# Patient Record
Sex: Female | Born: 1980
Health system: Southern US, Community
[De-identification: ages and names within clinical notes are randomized; demographics above are authoritative.]

## PROBLEM LIST (undated history)

## (undated) DIAGNOSIS — N809 Endometriosis, unspecified: Secondary | ICD-10-CM

## (undated) DIAGNOSIS — N189 Chronic kidney disease, unspecified: Secondary | ICD-10-CM

## (undated) DIAGNOSIS — F988 Other specified behavioral and emotional disorders with onset usually occurring in childhood and adolescence: Secondary | ICD-10-CM

## (undated) DIAGNOSIS — R519 Headache, unspecified: Secondary | ICD-10-CM

## (undated) DIAGNOSIS — R51 Headache: Secondary | ICD-10-CM

## (undated) DIAGNOSIS — R109 Unspecified abdominal pain: Secondary | ICD-10-CM

## (undated) DIAGNOSIS — N029 Recurrent and persistent hematuria with unspecified morphologic changes: Secondary | ICD-10-CM

## (undated) DIAGNOSIS — R10A1 Flank pain, right side: Secondary | ICD-10-CM

## (undated) HISTORY — PX: MOLE REMOVAL: SHX2046

## (undated) HISTORY — DX: Endometriosis, unspecified: N80.9

## (undated) HISTORY — PX: LAPAROSCOPY: SHX197

## (undated) HISTORY — PX: OTHER SURGICAL HISTORY: SHX169

---

## 2006-12-27 ENCOUNTER — Emergency Department: Payer: Self-pay | Admitting: Emergency Medicine

## 2008-01-08 ENCOUNTER — Ambulatory Visit: Payer: Self-pay | Admitting: Vascular Surgery

## 2008-01-17 ENCOUNTER — Ambulatory Visit: Payer: Self-pay | Admitting: Vascular Surgery

## 2008-01-23 ENCOUNTER — Ambulatory Visit: Payer: Self-pay | Admitting: Vascular Surgery

## 2008-03-05 ENCOUNTER — Ambulatory Visit: Payer: Self-pay | Admitting: Internal Medicine

## 2008-04-29 ENCOUNTER — Ambulatory Visit: Payer: Self-pay | Admitting: Internal Medicine

## 2010-11-07 HISTORY — PX: TYMPANOSTOMY TUBE PLACEMENT: SHX32

## 2010-11-07 HISTORY — PX: PARTIAL HYSTERECTOMY: SHX80

## 2011-11-30 ENCOUNTER — Ambulatory Visit: Payer: Self-pay | Admitting: Unknown Physician Specialty

## 2011-12-09 ENCOUNTER — Ambulatory Visit: Payer: Self-pay | Admitting: Unknown Physician Specialty

## 2012-11-07 HISTORY — PX: NASAL ENDOSCOPY: SHX286

## 2012-11-07 HISTORY — PX: COLONOSCOPY: SHX174

## 2012-12-03 DIAGNOSIS — Z0001 Encounter for general adult medical examination with abnormal findings: Secondary | ICD-10-CM | POA: Insufficient documentation

## 2012-12-03 DIAGNOSIS — Z0000 Encounter for general adult medical examination without abnormal findings: Secondary | ICD-10-CM | POA: Insufficient documentation

## 2013-06-14 ENCOUNTER — Ambulatory Visit: Payer: Self-pay | Admitting: Family Medicine

## 2013-11-07 HISTORY — PX: SKIN LESION EXCISION: SHX2412

## 2013-12-27 HISTORY — PX: APPENDECTOMY: SHX54

## 2013-12-27 HISTORY — PX: ABDOMINAL HYSTERECTOMY: SHX81

## 2014-01-08 DIAGNOSIS — N958 Other specified menopausal and perimenopausal disorders: Secondary | ICD-10-CM | POA: Insufficient documentation

## 2014-01-08 DIAGNOSIS — E8941 Symptomatic postprocedural ovarian failure: Secondary | ICD-10-CM | POA: Insufficient documentation

## 2014-02-25 ENCOUNTER — Other Ambulatory Visit: Payer: Self-pay

## 2014-02-25 LAB — COMPREHENSIVE METABOLIC PANEL
ALBUMIN: 4 g/dL (ref 3.4–5.0)
ALT: 13 U/L (ref 12–78)
ANION GAP: 5 — AB (ref 7–16)
Alkaline Phosphatase: 67 U/L
BUN: 10 mg/dL (ref 7–18)
Bilirubin,Total: 0.3 mg/dL (ref 0.2–1.0)
CALCIUM: 8.7 mg/dL (ref 8.5–10.1)
CO2: 28 mmol/L (ref 21–32)
Chloride: 106 mmol/L (ref 98–107)
Creatinine: 0.66 mg/dL (ref 0.60–1.30)
EGFR (African American): >60
EGFR (Non-African Amer.): >60
GLUCOSE: 87 mg/dL (ref 65–99)
Osmolality: 276 (ref 275–301)
Potassium: 3.9 mmol/L (ref 3.5–5.1)
SGOT(AST): 15 U/L (ref 15–37)
Sodium: 139 mmol/L (ref 136–145)
TOTAL PROTEIN: 7.7 g/dL (ref 6.4–8.2)

## 2014-02-25 LAB — CBC WITH DIFFERENTIAL/PLATELET
BASOS PCT: 0.7 %
Basophil #: 0.1 10*3/uL (ref 0.0–0.1)
Eosinophil #: 0.1 10*3/uL (ref 0.0–0.7)
Eosinophil %: 1.2 %
HCT: 41.3 % (ref 35.0–47.0)
HGB: 13.8 g/dL (ref 12.0–16.0)
LYMPHS ABS: 2.6 10*3/uL (ref 1.0–3.6)
Lymphocyte %: 32.8 %
MCH: 30.9 pg (ref 26.0–34.0)
MCHC: 33.3 g/dL (ref 32.0–36.0)
MCV: 93 fL (ref 80–100)
MONO ABS: 0.4 x10 3/mm (ref 0.2–0.9)
Monocyte %: 5.1 %
NEUTROS ABS: 4.7 10*3/uL (ref 1.4–6.5)
Neutrophil %: 60.2 %
PLATELETS: 271 10*3/uL (ref 150–440)
RBC: 4.46 10*6/uL (ref 3.80–5.20)
RDW: 12.8 % (ref 11.5–14.5)
WBC: 7.8 10*3/uL (ref 3.6–11.0)

## 2014-02-25 LAB — TSH: THYROID STIMULATING HORM: 1.3 u[IU]/mL

## 2014-02-28 ENCOUNTER — Ambulatory Visit: Payer: Self-pay

## 2014-03-11 ENCOUNTER — Ambulatory Visit: Payer: Self-pay | Admitting: Internal Medicine

## 2014-03-17 ENCOUNTER — Encounter: Payer: Self-pay | Admitting: General Surgery

## 2014-03-27 ENCOUNTER — Encounter: Payer: Self-pay | Admitting: General Surgery

## 2014-03-27 ENCOUNTER — Ambulatory Visit (INDEPENDENT_AMBULATORY_CARE_PROVIDER_SITE_OTHER): Payer: BC Managed Care – PPO | Admitting: General Surgery

## 2014-03-27 VITALS — BP 102/64 | HR 78 | Resp 12 | Ht 65.0 in | Wt 129.0 lb

## 2014-03-27 DIAGNOSIS — R109 Unspecified abdominal pain: Secondary | ICD-10-CM

## 2014-03-27 MED ORDER — DICYCLOMINE HCL 20 MG PO TABS: 20.0000 mg | ORAL_TABLET | Freq: Three times a day (TID) | ORAL | Status: DC

## 2014-03-27 NOTE — Progress Notes (Signed)
Patient ID: Sara Peck, female   DOB: 07-23-1981, 33 y.o.   MRN: [id number]  Chief Complaint  Patient presents with  . Abdominal Pain    HPI Sara Peck is a 33 y.o. female.  Here today for evaluation of abdominal pain. States the pain first started around February 2015 and has since then had appendectomy and right ovary /tube removed, Dr Christeen DouglasPhillip Shadock in [place]. The right upper quadrant pain still comes and goes. It does radiated to the back/flank area. The pain is sharpe and intense and occurs about daily with localized swelling "bloating" to that area. Chronic nausea but no vomiting. Does not seem to be associated with any foods. Weight change has varied over the last year.  Review of the medical record shows no absolute weight change over the past year.  Images from her laparoscopic exam were available for review. The gallbladder appeared normal. No adhesions. Normal color. No evidence of perihepatic inflammatory process. Minimal intra-abdominal adhesions.  Daily activity may trigger the abdominal swelling but not necessarily the pain.  Here today with her mom, Sara Peck.  HIDA scan was 03-11-14 ( no pain) and ultrasound done 02-28-14.  She does have a CT and cystoscopy in [place] this afternoon.   HPI  Past Medical History  Diagnosis Date  . Endometriosis     Past Surgical History  Procedure Laterality Date  . Abdominal hysterectomy  12-27-13  . Appendectomy  12-27-13  . Partial hysterectomy  Jan 2012    with left ovary/tube  . Laparoscopy      multiple  . Tympanostomy tube placement  Jan 2012  . Skin lesion excision  2015    back  . Nasal endoscopy  2014    [facility]  . Colonoscopy  2014    [facility]    Family History  Problem Relation Age of Onset  . Cancer Mother     thyroid    Social History History  Substance Use Topics  . Smoking status: Current Some Day Smoker  . Smokeless tobacco: Never Used  . Alcohol Use: Yes     Comment: occasionally beer     Allergies  Allergen Reactions  . Keflex [Cephalexin] Hives  . Sulfa Antibiotics Hives    Current Outpatient Prescriptions  Medication Sig Dispense Refill  . Norethin-Eth Estrad Triphasic (ALYACEN 7/7/7 PO) Take 1 tablet by mouth daily.      Marland Kitchen. dicyclomine (BENTYL) 20 MG tablet Take 1 tablet (20 mg total) by mouth 4 (four) times daily -  before meals and at bedtime.  60 tablet  0   No current facility-administered medications for this visit.    Review of Systems Review of Systems  Constitutional: Negative.   Respiratory: Negative.   Cardiovascular: Negative.   Gastrointestinal: Positive for nausea, abdominal pain and abdominal distention. Negative for vomiting, diarrhea, constipation and blood in stool.    Blood pressure 102/64, pulse 78, resp. rate 12, height 5\' 5"  (1.651 m), weight 129 lb (58.514 kg).  Physical Exam Physical Exam  Constitutional: She is oriented to person, place, and time. She appears well-developed and well-nourished.  Neck: Neck supple.  Cardiovascular: Normal rate, regular rhythm and normal heart sounds.   Pulmonary/Chest: Effort normal and breath sounds normal.  Abdominal: Soft. Normal appearance and bowel sounds are normal. There is no tenderness. No hernia.  Lymphadenopathy:    She has no cervical adenopathy.  Neurological: She is alert and oriented to person, place, and time.  Skin: Skin is warm and dry.  Data Reviewed PCP records.  Colonoscopy dated 09/16/2013 completed [facility] are probably was normal. Upper endoscopy was not completed at the time of that exam. Biopsy reports were not available.  Upper endoscopy was completed September 2014. Results are not available.  GI notes from Gerald StabsAlissa Garrett, M.D. were reviewed. Diagnosis of SIBS RX'd w/ Rifaxim and MiraLax without improvement per patient report. Abdominal ultrasound dated 02/28/2014 was unremarkable.  HIDA scan dated 03/11/2014 showed a gallbladder ejection fraction of  89%. No reproduction of symptoms during CCK infusion.  The patient had photos that she took of her abdomen over the course of the day demonstrating abdominal bloating.    Assessment    Abdominal pain, unclear etiology.    Plan    The images provided from her laparoscopy exam were fairly complete, showing no obvious intra-abdominal pathology (right tube/ovary and appendix removed during the procedure). No pathology is available from this procedure, but the post procedure office note dated 01/08/2014 reported her feeling well except for some residual pain at the port sites. Urinalysis dated 01/27/2014 was negative. CBC of the same date was unremarkable. Comments metabolic panel was normal    Call back with a status report after trying the medication for 2 weeks.   PCP: Sara Peck, Sara Peck (NP)           Sara Peck, Sara Peck   Sara Peck 03/28/2014, 10:21 AM

## 2014-03-27 NOTE — Patient Instructions (Addendum)
The patient is aware to call back for any questions or concerns. Call back with a status report after trying the medication for 2 weeks.

## 2014-03-28 DIAGNOSIS — R109 Unspecified abdominal pain: Secondary | ICD-10-CM | POA: Insufficient documentation

## 2014-04-08 ENCOUNTER — Ambulatory Visit: Payer: Self-pay | Admitting: General Surgery

## 2014-04-10 ENCOUNTER — Telehealth: Payer: Self-pay | Admitting: General Surgery

## 2014-04-10 NOTE — Telephone Encounter (Signed)
PATIENT CALLED TO REPORT AFTER TAKING BENTYL 20 MG FOR 2 WKS.SHE REPORTS THE MEDICINE DIDN'T SEEM TO HELP.SINCE HER LAST VISIT SHE HAS GOTTEN WORSE.SHE HAS BEEN VERY NAUSEA WITH VOMITING & DIARRHEA.SHE'S ABLE TO EAT SALTINE CREAK ERS AND DRINK SPRITE.IF MEDICATION NEEDS TO BE CALLED IN USE THE [facility] IN [place] [phone].

## 2014-04-15 NOTE — Telephone Encounter (Signed)
Patient had severe abdominal pain w/ N/V and watery diarrhea on April 09, 2014 after eating a cheeseburger. Recurrent symptoms after a hot dog on June 6th. Seen by Lutricia Feil, M.D. Yesterday. EGD scheduled for June 12. Given Levsin SL to try w/ pain episodes. Will bring CT completed by urology service in [place], [place] to office for review tomorrow. Asked to have Dr. Bluford Kaufmann put a copy of EGD in my box for review.  Further f/u after GI evaluation completed.

## 2014-04-18 ENCOUNTER — Ambulatory Visit: Payer: Self-pay | Admitting: Gastroenterology

## 2014-04-21 LAB — PATHOLOGY REPORT

## 2014-04-23 ENCOUNTER — Encounter: Payer: Self-pay | Admitting: General Surgery

## 2014-04-23 NOTE — Progress Notes (Signed)
Patient ID: Orvis BrillHolly Peck, female   DOB: 04-May-1981, 33 y.o.   MRN: [id number] The patient provided a copy of a CT of the abdomen and pelvis completed at [place] on 03/27/2014. This was an IV contrast study only. (With and without).  There is no clear pathology identified on my review. (The right distal ureter is not seen on the study, but no proximal dilatation is noted.  The appendix is surgically absent.  Upper endoscopy completed 04/18/2014 by Trina AoPaul O., M.D. was normal. Biopsies were obtained for H. pylori testing. Pathology was negative.

## 2014-04-24 ENCOUNTER — Telehealth: Payer: Self-pay | Admitting: *Deleted

## 2014-04-24 NOTE — Telephone Encounter (Signed)
I talked with the patient. She has already talked with Dr. Bluford Kaufmannh (post endo) and they made her an appt with Dr Lemar LivingsByrnett for reevaluation for cholecystectomy 04-30-14 because Dr Bluford Kaufmannh can't find anything wrong. She is at a loss for what to do?Marland Kitchen. She is aware Dr Lemar LivingsByrnett is out of town and it will be first part of next week before decision can be made about her appt. She is planning on calling Dr Bluford Kaufmannh office as well to tell them that surgery may not help her issue and see what Dr Bluford Kaufmannh says.

## 2014-04-24 NOTE — Telephone Encounter (Signed)
Message copied by Currie ParisHATCH, Lochlin Eppinger M on Thu Apr 24, 2014  8:17 AM ------      Message from: BiggsBYRNETT, Merrily PewJEFFREY W      Created: Wed Apr 23, 2014  8:44 PM       Please notify the patient that I reviewed her Mar 27, 2014 CT as well as her 04/18/2014 upper endoscopy report. I don't have anything to add at this time. Ask her she would like to CT disc returned. Thank you ------

## 2014-04-28 ENCOUNTER — Ambulatory Visit: Payer: BC Managed Care – PPO | Admitting: General Surgery

## 2014-04-30 ENCOUNTER — Ambulatory Visit: Payer: BC Managed Care – PPO | Admitting: General Surgery

## 2014-05-13 ENCOUNTER — Ambulatory Visit: Payer: Self-pay | Admitting: Surgery

## 2014-05-13 LAB — BASIC METABOLIC PANEL
Anion Gap: 5 — ABNORMAL LOW (ref 7–16)
BUN: 11 mg/dL (ref 7–18)
CALCIUM: 8.6 mg/dL (ref 8.5–10.1)
Chloride: 109 mmol/L — ABNORMAL HIGH (ref 98–107)
Co2: 25 mmol/L (ref 21–32)
Creatinine: 0.82 mg/dL (ref 0.60–1.30)
EGFR (African American): >60
EGFR (Non-African Amer.): >60
GLUCOSE: 91 mg/dL (ref 65–99)
Osmolality: 277 (ref 275–301)
Potassium: 3.7 mmol/L (ref 3.5–5.1)
SODIUM: 139 mmol/L (ref 136–145)

## 2014-05-13 LAB — HEPATIC FUNCTION PANEL A (ARMC)
AST: 12 U/L — AB (ref 15–37)
Albumin: 3.4 g/dL (ref 3.4–5.0)
Alkaline Phosphatase: 74 U/L
BILIRUBIN TOTAL: 0.2 mg/dL (ref 0.2–1.0)
Bilirubin, Direct: <0.1 mg/dL (ref 0.00–0.20)
SGPT (ALT): 16 U/L (ref 12–78)
TOTAL PROTEIN: 6.8 g/dL (ref 6.4–8.2)

## 2014-05-13 LAB — CBC WITH DIFFERENTIAL/PLATELET
BASOS ABS: 0.1 10*3/uL (ref 0.0–0.1)
Basophil %: 0.8 %
EOS PCT: 4 %
Eosinophil #: 0.3 10*3/uL (ref 0.0–0.7)
HCT: 38.1 % (ref 35.0–47.0)
HGB: 12.8 g/dL (ref 12.0–16.0)
LYMPHS ABS: 2.6 10*3/uL (ref 1.0–3.6)
LYMPHS PCT: 39.3 %
MCH: 31.4 pg (ref 26.0–34.0)
MCHC: 33.7 g/dL (ref 32.0–36.0)
MCV: 93 fL (ref 80–100)
MONOS PCT: 7.4 %
Monocyte #: 0.5 x10 3/mm (ref 0.2–0.9)
Neutrophil #: 3.2 10*3/uL (ref 1.4–6.5)
Neutrophil %: 48.5 %
Platelet: 254 10*3/uL (ref 150–440)
RBC: 4.08 10*6/uL (ref 3.80–5.20)
RDW: 13.4 % (ref 11.5–14.5)
WBC: 6.7 10*3/uL (ref 3.6–11.0)

## 2014-05-14 ENCOUNTER — Ambulatory Visit: Payer: Self-pay | Admitting: Surgery

## 2014-05-15 LAB — PATHOLOGY REPORT

## 2014-06-30 ENCOUNTER — Ambulatory Visit: Payer: Self-pay | Admitting: Internal Medicine

## 2014-07-07 ENCOUNTER — Ambulatory Visit: Payer: Self-pay | Admitting: Internal Medicine

## 2014-07-07 LAB — CBC WITH DIFFERENTIAL/PLATELET
Basophil #: 0.1 10*3/uL (ref 0.0–0.1)
Basophil %: 0.7 %
EOS PCT: 0.8 %
Eosinophil #: 0.1 10*3/uL (ref 0.0–0.7)
HCT: 40.3 % (ref 35.0–47.0)
HGB: 13.3 g/dL (ref 12.0–16.0)
LYMPHS ABS: 2.4 10*3/uL (ref 1.0–3.6)
LYMPHS PCT: 23.5 %
MCH: 31.1 pg (ref 26.0–34.0)
MCHC: 33 g/dL (ref 32.0–36.0)
MCV: 94 fL (ref 80–100)
MONOS PCT: 3.9 %
Monocyte #: 0.4 x10 3/mm (ref 0.2–0.9)
Neutrophil #: 7.2 10*3/uL — ABNORMAL HIGH (ref 1.4–6.5)
Neutrophil %: 71.1 %
Platelet: 299 10*3/uL (ref 150–440)
RBC: 4.27 10*6/uL (ref 3.80–5.20)
RDW: 13.2 % (ref 11.5–14.5)
WBC: 10.1 10*3/uL (ref 3.6–11.0)

## 2014-07-07 LAB — COMPREHENSIVE METABOLIC PANEL
ALK PHOS: 73 U/L
ANION GAP: 7 (ref 7–16)
AST: 18 U/L (ref 15–37)
Albumin: 3.8 g/dL (ref 3.4–5.0)
BUN: 9 mg/dL (ref 7–18)
Bilirubin,Total: 0.5 mg/dL (ref 0.2–1.0)
CREATININE: 0.79 mg/dL (ref 0.60–1.30)
Calcium, Total: 8.4 mg/dL — ABNORMAL LOW (ref 8.5–10.1)
Chloride: 107 mmol/L (ref 98–107)
Co2: 25 mmol/L (ref 21–32)
GLUCOSE: 82 mg/dL (ref 65–99)
Osmolality: 275 (ref 275–301)
POTASSIUM: 3.8 mmol/L (ref 3.5–5.1)
SGPT (ALT): 14 U/L
Sodium: 139 mmol/L (ref 136–145)
Total Protein: 7.4 g/dL (ref 6.4–8.2)

## 2014-07-07 LAB — IRON AND TIBC
Iron Bind.Cap.(Total): 564 ug/dL — ABNORMAL HIGH (ref 250–450)
Iron Saturation: 18 %
Iron: 102 ug/dL (ref 50–170)
Unbound Iron-Bind.Cap.: 462 ug/dL

## 2014-07-07 LAB — MAGNESIUM: MAGNESIUM: 1.8 mg/dL

## 2014-07-07 LAB — FERRITIN: Ferritin (ARMC): 24 ng/mL (ref 8–388)

## 2014-07-07 LAB — TSH: Thyroid Stimulating Horm: 1.05 u[IU]/mL

## 2014-09-01 IMAGING — CT CT STONE STUDY
3 of 4 series · 4 of 16 positions shown, 5 images · non-contrast
Comparison: Enhanced CT abdomen and pelvis 06/14/2013.

CLINICAL DATA: One week history of right flank pain. Microscopic
hematuria. Surgical history includes cholecystectomy, appendectomy
and hysterectomy.

EXAM:
CT ABDOMEN AND PELVIS WITHOUT CONTRAST
TECHNIQUE: Multidetector CT imaging of the abdomen and pelvis was performed
following the standard protocol without IV contrast.

[Series 4: lung · axial · 0.62mm/px · z∈[-244,-244]mm · 1 of 23 slices shown, 2 images]
[im 1/23  soft-tissue]
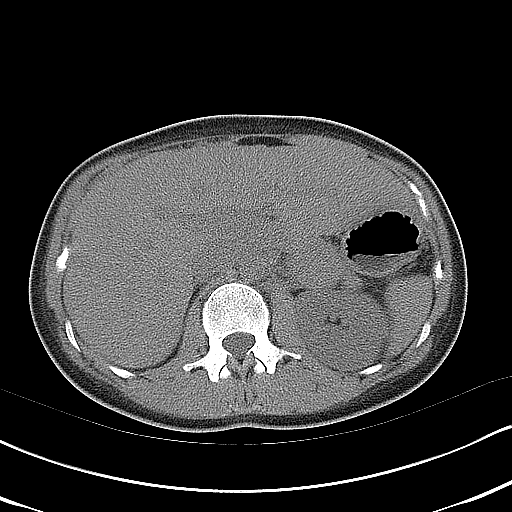
[im 1/23  bone]
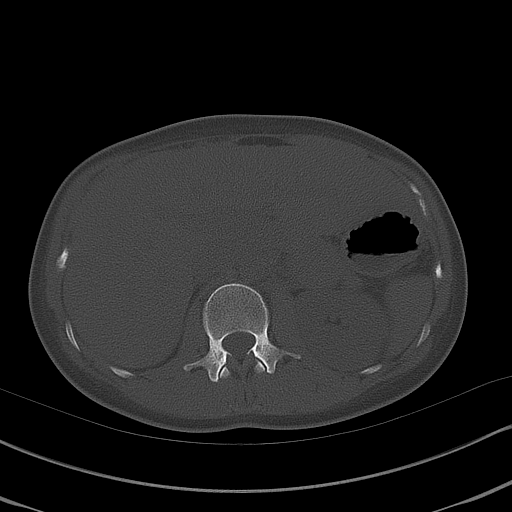

[Series 5: coronal · coronal · 0.64mm/px · 2 of 96 slices shown]
[im 32/96  soft-tissue]
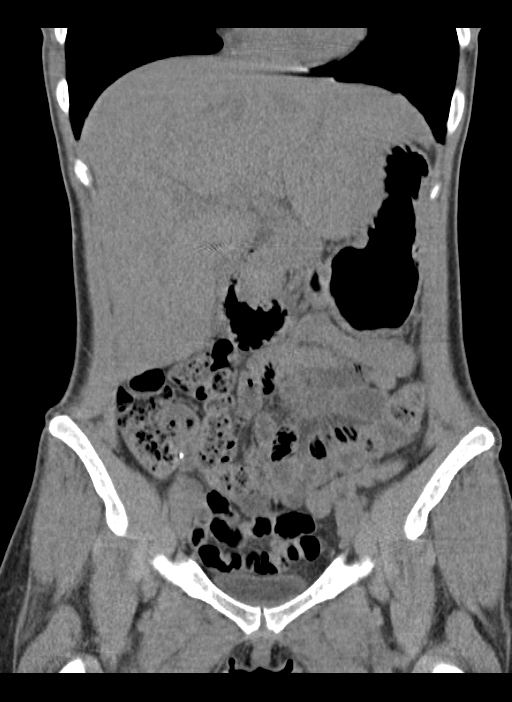
[im 64/96  soft-tissue]
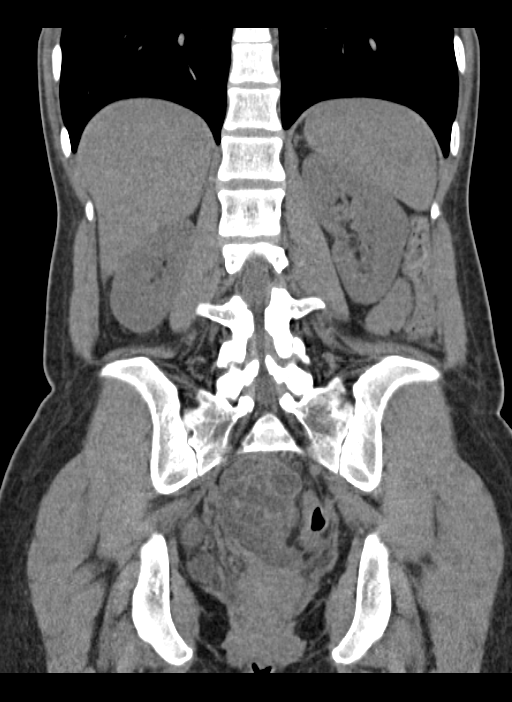

[Series 6: sagittal · sagittal · 0.53mm/px · 1 of 152 slices shown]
[im 76/152  soft-tissue]
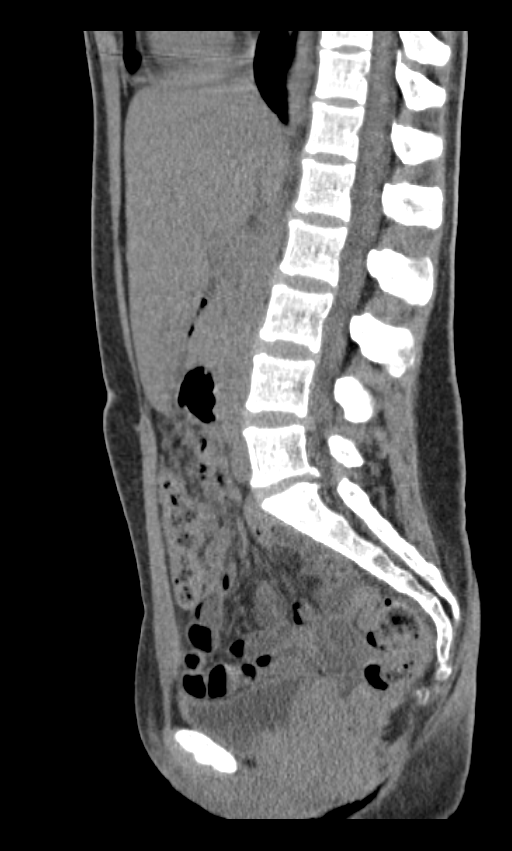

[4 of 16 positions shown; findings below may reference images not displayed]

FINDINGS: No evidence of urinary tract calculi or obstruction on either side.
Within the limits of the unenhanced technique, no focal parenchymal
abnormality involving either kidney.

Mild to moderate hepatomegaly, unchanged. Allowing for the
unenhanced technique, no focal hepatic parenchymal abnormality.
Normal unenhanced appearance of the spleen, pancreas, and adrenal
glands. Gallbladder surgically absent. No biliary ductal dilation.
No visible aortoiliofemoral atherosclerosis. No significant
lymphadenopathy.

Normal-appearing stomach and small bowel. Moderate stool burden in
the normal appearing colon. Appendix no ascites.

Uterus surgically absent. No adnexal masses or free pelvic fluid.
Urinary bladder normal in appearance. Surgically absent.

Bone window images demonstrate a right paracentral disc protrusion
at L4-5 and a central disc protrusion with calcification in the
posterior annular fibers at L5-S1, unchanged. Visualized lung bases
clear. Heart size normal.
IMPRESSION: 1. No acute abnormality involving the abdomen or pelvis.
2. No evidence of urinary tract calculi or obstruction on either
side.
3. Mild to moderate hepatomegaly, unchanged since the examination 1
year ago.
4. Disc protrusions at L4-5 and L5-S1, unchanged.

## 2014-09-08 ENCOUNTER — Encounter: Payer: Self-pay | Admitting: General Surgery

## 2014-09-15 ENCOUNTER — Ambulatory Visit: Payer: Self-pay | Admitting: Unknown Physician Specialty

## 2014-09-20 DIAGNOSIS — R3129 Other microscopic hematuria: Secondary | ICD-10-CM | POA: Insufficient documentation

## 2014-09-20 DIAGNOSIS — IMO0002 Reserved for concepts with insufficient information to code with codable children: Secondary | ICD-10-CM | POA: Insufficient documentation

## 2014-11-20 ENCOUNTER — Emergency Department: Payer: Self-pay | Admitting: Emergency Medicine

## 2014-11-20 LAB — COMPREHENSIVE METABOLIC PANEL
ALBUMIN: 3.6 g/dL (ref 3.4–5.0)
Alkaline Phosphatase: 84 U/L
Anion Gap: 5 — ABNORMAL LOW (ref 7–16)
BILIRUBIN TOTAL: 0.2 mg/dL (ref 0.2–1.0)
BUN: 10 mg/dL (ref 7–18)
CALCIUM: 8.5 mg/dL (ref 8.5–10.1)
CREATININE: 0.77 mg/dL (ref 0.60–1.30)
Chloride: 108 mmol/L — ABNORMAL HIGH (ref 98–107)
Co2: 28 mmol/L (ref 21–32)
EGFR (African American): >60
GLUCOSE: 78 mg/dL (ref 65–99)
OSMOLALITY: 279 (ref 275–301)
POTASSIUM: 4.3 mmol/L (ref 3.5–5.1)
SGOT(AST): 18 U/L (ref 15–37)
SGPT (ALT): 16 U/L
SODIUM: 141 mmol/L (ref 136–145)
Total Protein: 7.2 g/dL (ref 6.4–8.2)

## 2014-11-20 LAB — CBC WITH DIFFERENTIAL/PLATELET
BASOS PCT: 1 %
Basophil #: 0.1 10*3/uL (ref 0.0–0.1)
EOS PCT: 2.3 %
Eosinophil #: 0.2 10*3/uL (ref 0.0–0.7)
HCT: 40.5 % (ref 35.0–47.0)
HGB: 13.4 g/dL (ref 12.0–16.0)
LYMPHS ABS: 2.6 10*3/uL (ref 1.0–3.6)
Lymphocyte %: 33.1 %
MCH: 31.2 pg (ref 26.0–34.0)
MCHC: 33.1 g/dL (ref 32.0–36.0)
MCV: 94 fL (ref 80–100)
Monocyte #: 0.4 x10 3/mm (ref 0.2–0.9)
Monocyte %: 5.7 %
Neutrophil #: 4.5 10*3/uL (ref 1.4–6.5)
Neutrophil %: 57.9 %
Platelet: 316 10*3/uL (ref 150–440)
RBC: 4.29 10*6/uL (ref 3.80–5.20)
RDW: 13.6 % (ref 11.5–14.5)
WBC: 7.7 10*3/uL (ref 3.6–11.0)

## 2014-11-20 LAB — URINALYSIS, COMPLETE
BILIRUBIN, UR: NEGATIVE
Bacteria: NONE SEEN
Glucose,UR: NEGATIVE mg/dL (ref 0–75)
Ketone: NEGATIVE
Leukocyte Esterase: NEGATIVE
Nitrite: NEGATIVE
PH: 6 (ref 4.5–8.0)
Protein: NEGATIVE
Specific Gravity: 1.01 (ref 1.003–1.030)

## 2014-11-20 LAB — LIPASE, BLOOD: Lipase: 47 U/L — ABNORMAL LOW (ref 73–393)

## 2014-12-24 DIAGNOSIS — F988 Other specified behavioral and emotional disorders with onset usually occurring in childhood and adolescence: Secondary | ICD-10-CM | POA: Insufficient documentation

## 2015-01-19 DIAGNOSIS — N809 Endometriosis, unspecified: Secondary | ICD-10-CM | POA: Insufficient documentation

## 2015-02-28 NOTE — Op Note (Signed)
PATIENT NAME:  Sara Peck, Sara Peck MR#:  [id number] DATE OF BIRTH:  [birth year]  DATE OF PROCEDURE:  05/14/2014  PREOPERATIVE DIAGNOSIS: Biliary dyskinesia.   POSTOPERATIVE DIAGNOSIS: Biliary dyskinesia.   PROCEDURE: Laparoscopic cholecystectomy.   SURGEON: Dionne Miloichard Juwaun Inskeep, MD   ANESTHESIA: General with endotracheal tube.   INDICATIONS: This is a patient with recurrent epigastric and right upper quadrant pain associated with fatty food intolerance and work-up showing biliary dyskinesia. She has a high ejection fraction but reproduced pain on CCK.   Preoperatively, we discussed rationale for surgery, the options of observation, risk of bleeding, infection, recurrence of symptoms, failure to resolve her symptoms, open procedure, bile duct damage, bile duct leak, retained common bile duct stone, any of which could require further surgery and/or ERCP, stent, and papillotomy. This is all reviewed for her and her family in the preoperative holding area. They understood and agreed to proceed.   FINDINGS: Adhesions in the area of the gallbladder, small cystic duct, thick bile.   DESCRIPTION OF PROCEDURE: The patient was induced to general anesthesia, given IV antibiotics. VTE prophylaxis was in place. She was prepped and draped in a sterile fashion. Marcaine was infiltrated in skin and subcutaneous tissues around the periumbilical area. Incision was made. Veress needle was placed. Pneumoperitoneum was obtained.  A 5 mm trocar port was placed. The abdominal cavity was explored, and under direct vision, a 10 mm epigastric port and 2 lateral 5 mm ports were placed. The gallbladder was placed on tension. The peritoneum over the infundibulum was incised bluntly after taking down some adhesions bluntly without the use of energy. The cystic artery was well identified, doubly clipped, and divided. This allowed for good visualization of the cystic duct as it entered the infundibulum here. It was doubly clipped and  divided and the gallbladder was taken from the gallbladder fossa with electrocautery and passed out through the epigastric port site with the aid of an Endo Catch bag. The area was irrigated with copious amounts of normal saline, checked for hemostasis. There was no sign of bleeding, bile leak or bowel injury. The camera was placed in the epigastric site to view back to the periumbilical site. There was no sign of adhesions or bowel injury. Therefore, pneumoperitoneum was released. All ports were removed. Fascial edges at the epigastric site were approximated with figure-of-eight 0 Vicryls; 4-0 subcuticular Monocryl was used on all skin edges. Steri-Strips, Mastisol, and sterile dressings were placed. The patient tolerated the procedure well. There were no complications. She was taken to the recovery room in stable condition to be discharged to the care of her family.  Follow up in 10 days.    ____________________________ Adah Salvageichard E. Excell Seltzerooper, MD rec:dd D: 05/14/2014 13:24:11 ET T: 05/15/2014 02:40:54 ET JOB#: [id number]  cc: Adah Salvageichard E. Excell Seltzerooper, MD, <Dictator> Lattie HawICHARD E Gaye Scorza MD ELECTRONICALLY SIGNED 05/19/2014 19:38

## 2015-02-28 NOTE — H&P (Signed)
PATIENT NAME:  Sara BilletHUNTER, Minnah N MR#:  [id number] DATE OF BIRTH:  February 18, 1981  DATE OF ADMISSION:  05/14/2014  DATE OF PROCEDURE: Will be 05/14/2014.  CHIEF COMPLAINT: Right upper quadrant pain.   HISTORY OF PRESENT ILLNESS: This is a patient with recurrent and episodic right upper quadrant pain associated with fatty food intolerance and workup showing biliary dyskinesia with reproduction of her pain. She had a HIDA scan that showed an ejection fraction of 89%, but this reproduced her pain, and she has multiple family members with gallbladder disease. She has lost 12 pounds and vomits nearly daily. She is here for elective laparoscopic cholecystectomy. She has had a normal EGD and no improvement on PPI.   PAST MEDICAL HISTORY: Endometriosis.   PAST SURGICAL HISTORY: Hysterectomy, appendectomy and myringotomy tubes.   ALLERGIES: SULFA AND KEFLEX.   MEDICATIONS: Phenergan.   FAMILY HISTORY: Multiple family members with gallbladder disease.   REVIEW OF SYSTEMS: A 10-system review is documented in the office chart and negative with the exception of that mentioned in the HPI.   PHYSICAL EXAMINATION:  GENERAL: A healthy female patient.  VITAL SIGNS: Weight 130 pounds. Vital signs are stable.  HEENT: Shows no scleral icterus.  NECK: No palpable neck nodes.  CHEST: Clear to auscultation.  CARDIAC: Regular rate and rhythm.  ABDOMEN: Soft, nontender.  EXTREMITIES: Without edema.  NEUROLOGIC: Grossly intact.  INTEGUMENT: Shows no jaundice.  IMAGING: HIDA scan showed an 89% ejection fraction, with reproduction of her pain. An ultrasound is negative for stones.   LABORATORY DATA: H and H of 12.8 and 38, and a platelet count of 254. Liver function tests within normal limits.   ASSESSMENT AND PLAN: This is a patient with biliary dyskinesia, with reproduction of her pain with CCK injection. I have recommended laparoscopic cholecystectomy for control of her symptoms, understanding that this may not  resolve all of her symptoms. This risk was emphasized for her, but she has had an EGD, has been on proton pump inhibitors to no avail. She continues to have these classic symptoms of gallbladder disease, and I am recommending laparoscopic cholecystectomy. The options have been discussed. The risks of bleeding, infection, return of her symptoms, failure to resolve all of her symptoms, open procedure, bile duct damage, bile duct leak, retained common bile duct stone, any of which could require further surgery and/or ERCP, stent and papillotomy, have all been discussed with her. She understood and agreed to proceed.   ____________________________ Adah Salvageichard E. Excell Seltzerooper, MD rec:lb D: 05/14/2014 09:02:26 ET T: 05/14/2014 09:12:34 ET JOB#: [id number]  cc: Adah Salvageichard E. Excell Seltzerooper, MD, <Dictator> Lattie HawICHARD E Gian Ybarra MD ELECTRONICALLY SIGNED 05/19/2014 19:38

## 2015-08-14 ENCOUNTER — Other Ambulatory Visit
Admission: RE | Admit: 2015-08-14 | Discharge: 2015-08-14 | Disposition: A | Discharge status: Home / Self Care | Payer: BLUE CROSS/BLUE SHIELD | Source: Ambulatory Visit | Attending: Nurse Practitioner | Admitting: Nurse Practitioner

## 2015-08-14 DIAGNOSIS — R5383 Other fatigue: Secondary | ICD-10-CM | POA: Diagnosis not present

## 2015-08-14 DIAGNOSIS — Z0000 Encounter for general adult medical examination without abnormal findings: Secondary | ICD-10-CM | POA: Diagnosis present

## 2015-08-14 DIAGNOSIS — E039 Hypothyroidism, unspecified: Secondary | ICD-10-CM | POA: Diagnosis present

## 2015-08-14 LAB — COMPREHENSIVE METABOLIC PANEL
ALK PHOS: 83 U/L (ref 38–126)
ALT: 14 U/L (ref 14–54)
AST: 19 U/L (ref 15–41)
Albumin: 4.6 g/dL (ref 3.5–5.0)
Anion gap: 8 (ref 5–15)
BILIRUBIN TOTAL: 0.7 mg/dL (ref 0.3–1.2)
BUN: 13 mg/dL (ref 6–20)
CALCIUM: 9.4 mg/dL (ref 8.9–10.3)
CHLORIDE: 102 mmol/L (ref 101–111)
CO2: 29 mmol/L (ref 22–32)
CREATININE: 0.91 mg/dL (ref 0.44–1.00)
Glucose, Bld: 85 mg/dL (ref 65–99)
Potassium: 4.1 mmol/L (ref 3.5–5.1)
Sodium: 139 mmol/L (ref 135–145)
TOTAL PROTEIN: 7.4 g/dL (ref 6.5–8.1)

## 2015-08-14 LAB — CBC
HEMATOCRIT: 41.8 % (ref 35.0–47.0)
Hemoglobin: 14.4 g/dL (ref 12.0–16.0)
MCH: 31.6 pg (ref 26.0–34.0)
MCHC: 34.4 g/dL (ref 32.0–36.0)
MCV: 91.8 fL (ref 80.0–100.0)
Platelets: 305 10*3/uL (ref 150–440)
RBC: 4.56 MIL/uL (ref 3.80–5.20)
RDW: 14 % (ref 11.5–14.5)
WBC: 7.4 10*3/uL (ref 3.6–11.0)

## 2015-08-14 LAB — LIPID PANEL
CHOLESTEROL: 132 mg/dL (ref 0–200)
HDL: 50 mg/dL (ref >40)
LDL Cholesterol: 70 mg/dL (ref 0–99)
TRIGLYCERIDES: 59 mg/dL (ref <150)
Total CHOL/HDL Ratio: 2.6 RATIO
VLDL: 12 mg/dL (ref 0–40)

## 2015-08-14 LAB — T4, FREE: FREE T4: 1.01 ng/dL (ref 0.61–1.12)

## 2015-08-14 LAB — TSH: TSH: 1.925 u[IU]/mL (ref 0.350–4.500)

## 2015-08-15 LAB — T3: T3 TOTAL: 148 ng/dL (ref 71–180)

## 2016-02-18 DIAGNOSIS — D485 Neoplasm of uncertain behavior of skin: Secondary | ICD-10-CM | POA: Diagnosis not present

## 2016-02-18 DIAGNOSIS — D2272 Melanocytic nevi of left lower limb, including hip: Secondary | ICD-10-CM | POA: Diagnosis not present

## 2016-02-29 DIAGNOSIS — N200 Calculus of kidney: Secondary | ICD-10-CM | POA: Diagnosis not present

## 2016-03-03 DIAGNOSIS — R319 Hematuria, unspecified: Secondary | ICD-10-CM | POA: Diagnosis not present

## 2016-03-03 DIAGNOSIS — R3129 Other microscopic hematuria: Secondary | ICD-10-CM | POA: Diagnosis not present

## 2016-03-03 DIAGNOSIS — F908 Attention-deficit hyperactivity disorder, other type: Secondary | ICD-10-CM | POA: Diagnosis not present

## 2016-03-10 DIAGNOSIS — H6983 Other specified disorders of Eustachian tube, bilateral: Secondary | ICD-10-CM | POA: Diagnosis not present

## 2016-03-16 ENCOUNTER — Encounter: Payer: Self-pay | Admitting: *Deleted

## 2016-03-17 NOTE — Discharge Instructions (Signed)
[facility] °DISCHARGE INSTRUCTIONS FOR MYRINGOTOMY AND TUBE INSERTION ° °[facility] °PAUL JUENGEL, M.D. °CHAPMAN T. MCQUEEN, M.D. °SCOTT BENNETT, M.D. °CREIGHTON VAUGHT, M.D. ° °Diet:   After surgery, the patient should take only liquids and foods as tolerated.  The patient may then have a regular diet after the effects of anesthesia have worn off, usually about four to six hours after surgery. ° °Activities:   The patient should rest until the effects of anesthesia have worn off.  After this, there are no restrictions on the normal daily activities. ° °Medications:   You will be given antibiotic drops to be used in the ears postoperatively.  It is recommended to use 4 drops 2 times a day for 4 days, then the drops should be saved for possible future use. ° °The tubes should not cause any discomfort to the patient, but if there is any question, Tylenol should be given according to the instructions for the age of the patient. ° °Other medications should be continued normally. ° °Precautions:   Should there be recurrent drainage after the tubes are placed, the drops should be used for approximately 3-4 days.  If it does not clear, you should call the ENT office. ° °Earplugs:   Earplugs are only needed for those who are going to be submerged under water.  When taking a bath or shower and using a cup or showerhead to rinse hair, it is not necessary to wear earplugs.  These come in a variety of fashions, all of which can be obtained at our office.  However, if one is not able to come by the office, then silicone plugs can be found at most pharmacies.  It is not advised to stick anything in the ear that is not approved as an earplug.  Silly putty is not to be used as an earplug.  Swimming is allowed in patients after ear tubes are inserted, however, they must wear earplugs if they are going to be submerged under water.  For those children who are going to be swimming a lot, it is  recommended to use a fitted ear mold, which can be made by our audiologist.  If discharge is noticed from the ears, this most likely represents an ear infection.  We would recommend getting your eardrops and using them as indicated above.  If it does not clear, then you should call the ENT office.  For follow up, the patient should return to the ENT office three weeks postoperatively and then every six months as required by the doctor. ° ° °General Anesthesia, Adult, Care After °Refer to this sheet in the next few weeks. These instructions provide you with information on caring for yourself after your procedure. Your health care provider may also give you more specific instructions. Your treatment has been planned according to current medical practices, but problems sometimes occur. Call your health care provider if you have any problems or questions after your procedure. °WHAT TO EXPECT AFTER THE PROCEDURE °After the procedure, it is typical to experience: °· Sleepiness. °· Nausea and vomiting. °HOME CARE INSTRUCTIONS °· For the first 24 hours after general anesthesia: °¨ Have a responsible person with you. °¨ Do not drive a car. If you are alone, do not take public transportation. °¨ Do not drink alcohol. °¨ Do not take medicine that has not been prescribed by your health care provider. °¨ Do not sign important papers or make important decisions. °¨ You may resume a   normal diet and activities as directed by your health care provider. °· Change bandages (dressings) as directed. °· If you have questions or problems that seem related to general anesthesia, call the hospital and ask for the anesthetist or anesthesiologist on call. °SEEK MEDICAL CARE IF: °· You have nausea and vomiting that continue the day after anesthesia. °· You develop a rash. °SEEK IMMEDIATE MEDICAL CARE IF:  °· You have difficulty breathing. °· You have chest pain. °· You have any allergic problems. °  °This information is not intended to replace  advice given to you by your health care provider. Make sure you discuss any questions you have with your health care provider. °  °Document Released: 01/30/2001 Document Revised: 11/14/2014 Document Reviewed: 02/22/2012 °Elsevier Interactive Patient Education ©2016 [employer] Inc[employer] ° °

## 2016-03-18 ENCOUNTER — Ambulatory Visit: Payer: BLUE CROSS/BLUE SHIELD | Admitting: Anesthesiology

## 2016-03-18 ENCOUNTER — Encounter
Admission: RE | Disposition: A | Discharge status: Home / Self Care | Payer: Self-pay | Source: Ambulatory Visit | Attending: Unknown Physician Specialty

## 2016-03-18 ENCOUNTER — Ambulatory Visit
Admission: RE | Admit: 2016-03-18 | Discharge: 2016-03-18 | Disposition: A | Discharge status: Home / Self Care | Payer: BLUE CROSS/BLUE SHIELD | Source: Ambulatory Visit | Attending: Unknown Physician Specialty | Admitting: Unknown Physician Specialty

## 2016-03-18 DIAGNOSIS — H6692 Otitis media, unspecified, left ear: Secondary | ICD-10-CM | POA: Insufficient documentation

## 2016-03-18 DIAGNOSIS — Z9889 Other specified postprocedural states: Secondary | ICD-10-CM | POA: Insufficient documentation

## 2016-03-18 DIAGNOSIS — Z90710 Acquired absence of both cervix and uterus: Secondary | ICD-10-CM | POA: Diagnosis not present

## 2016-03-18 DIAGNOSIS — H6523 Chronic serous otitis media, bilateral: Secondary | ICD-10-CM | POA: Diagnosis not present

## 2016-03-18 DIAGNOSIS — Z9049 Acquired absence of other specified parts of digestive tract: Secondary | ICD-10-CM | POA: Insufficient documentation

## 2016-03-18 DIAGNOSIS — Z882 Allergy status to sulfonamides status: Secondary | ICD-10-CM | POA: Diagnosis not present

## 2016-03-18 DIAGNOSIS — H6690 Otitis media, unspecified, unspecified ear: Secondary | ICD-10-CM | POA: Diagnosis present

## 2016-03-18 DIAGNOSIS — Z808 Family history of malignant neoplasm of other organs or systems: Secondary | ICD-10-CM | POA: Diagnosis not present

## 2016-03-18 DIAGNOSIS — Z881 Allergy status to other antibiotic agents status: Secondary | ICD-10-CM | POA: Diagnosis not present

## 2016-03-18 DIAGNOSIS — H663X3 Other chronic suppurative otitis media, bilateral: Secondary | ICD-10-CM | POA: Diagnosis not present

## 2016-03-18 DIAGNOSIS — Z87891 Personal history of nicotine dependence: Secondary | ICD-10-CM | POA: Diagnosis not present

## 2016-03-18 HISTORY — DX: Recurrent and persistent hematuria with unspecified morphologic changes: N02.9

## 2016-03-18 HISTORY — DX: Headache, unspecified: R51.9

## 2016-03-18 HISTORY — DX: Headache: R51

## 2016-03-18 HISTORY — DX: Chronic kidney disease, unspecified: N18.9

## 2016-03-18 HISTORY — PX: MYRINGOTOMY WITH TUBE PLACEMENT: SHX5663

## 2016-03-18 HISTORY — DX: Unspecified abdominal pain: R10.9

## 2016-03-18 HISTORY — DX: Other specified behavioral and emotional disorders with onset usually occurring in childhood and adolescence: F98.8

## 2016-03-18 HISTORY — DX: Flank pain, right side: R10.A1

## 2016-03-18 SURGERY — MYRINGOTOMY WITH TUBE PLACEMENT
Anesthesia: General | Site: Ear | Laterality: Bilateral | Wound class: Clean Contaminated

## 2016-03-18 MED ORDER — LIDOCAINE HCL (CARDIAC) 20 MG/ML IV SOLN
INTRAVENOUS | Status: DC | PRN
  Administered 2016-03-18: 50 mg via INTRAVENOUS

## 2016-03-18 MED ORDER — MIDAZOLAM HCL 2 MG/2ML IJ SOLN
INTRAMUSCULAR | Status: DC | PRN
  Administered 2016-03-18: 2 mg via INTRAVENOUS

## 2016-03-18 MED ORDER — OFLOXACIN 0.3 % OT SOLN
OTIC | Status: DC | PRN
  Administered 2016-03-18: 4 [drp] via OTIC

## 2016-03-18 MED ORDER — FENTANYL CITRATE (PF) 100 MCG/2ML IJ SOLN
25.0000 ug | INTRAMUSCULAR | Status: DC | PRN
  Administered 2016-03-18: 25 ug via INTRAVENOUS

## 2016-03-18 MED ORDER — ONDANSETRON HCL 4 MG/2ML IJ SOLN
INTRAMUSCULAR | Status: DC | PRN
  Administered 2016-03-18: 4 mg via INTRAVENOUS

## 2016-03-18 MED ORDER — LACTATED RINGERS IV SOLN
INTRAVENOUS | Status: DC
  Administered 2016-03-18: 10:00:00 via INTRAVENOUS

## 2016-03-18 MED ORDER — PROPOFOL 10 MG/ML IV BOLUS
INTRAVENOUS | Status: DC | PRN
  Administered 2016-03-18: 100 mg via INTRAVENOUS

## 2016-03-18 MED ORDER — OXYCODONE HCL 5 MG PO TABS
5.0000 mg | ORAL_TABLET | Freq: Once | ORAL | Status: AC
  Administered 2016-03-18: 5 mg via ORAL

## 2016-03-18 MED ORDER — ONDANSETRON HCL 4 MG/2ML IJ SOLN: 4.0000 mg | Freq: Once | INTRAMUSCULAR | Status: DC | PRN

## 2016-03-18 SURGICAL SUPPLY — 12 items
BLADE MYR LANCE NRW W/HDL (BLADE) ×2 IMPLANT
CANISTER SUCT 1200ML W/VALVE (MISCELLANEOUS) ×2 IMPLANT
COTTONBALL LRG STERILE PKG (GAUZE/BANDAGES/DRESSINGS) ×2 IMPLANT
GLOVE BIO SURGEON STRL SZ7.5 (GLOVE) ×4 IMPLANT
KIT ROOM TURNOVER OR (KITS) ×2 IMPLANT
STRAP BODY AND KNEE 60X3 (MISCELLANEOUS) ×2 IMPLANT
TOWEL OR 17X26 4PK STRL BLUE (TOWEL DISPOSABLE) ×2 IMPLANT
TUBE EAR ARMSTRONG HC 1.14X3.5 (OTOLOGIC RELATED) IMPLANT
TUBE EAR T 1.27X4.5 GO LF (OTOLOGIC RELATED) IMPLANT
TUBE EAR T 1.27X5.3 BFLY (OTOLOGIC RELATED) ×4 IMPLANT
TUBING CONN 6MMX3.1M (TUBING) ×1
TUBING SUCTION CONN 0.25 STRL (TUBING) ×1 IMPLANT

## 2016-03-18 NOTE — Op Note (Signed)
03/18/2016  10:36 AM    Sara Peck, Sara Peck  [id number]   Pre-Op Dx: Otitis Media  Post-op Dx: Same  Proc:Bilateral myringotomy with tubes  Surg: Sara Peck,Sara Peck  Anes:  General by mask  EBL:  None  Findings:  R-cartilage graft in position, L-severe posterior retraction, no cholesteatoma seen  Procedure: With the patient in a comfortable supine position, general mask anesthesia was administered.  At an appropriate level, microscope and speculum were used to examine and clean the RIGHT ear canal.  The findings were as described above.  An anterior inferior radial myringotomy incision was sharply executed.  Middle ear contents were suctioned clear.  A butterfly PE tube was placed without difficulty.  Ciprodex otic solution was instilled into the external canal, and insufflated into the middle ear.  A cotton ball was placed at the external meatus. Hemostasis was observed.  This side was completed.  After completing the RIGHT side, the LEFT side was done in identical fashion.  On the left there was a severe posterior retraction myringotomy was performed just anterior to the retraction the retraction pocket was in suction outward the butterfly tube was placed through the myringotomy the posterior wing of the butterfly tube was rotated so that was beneath this retraction area help hold this into position Ciprodex  drops instilled in this canal followed by cotton ball.  Following this  The patient was returned to anesthesia, awakened, and transferred to recovery in stable condition.  Dispo:  PACU to home  Plan: Routine drop use and water precautions.  Recheck my office three weeks.   Sara Peck  10:36 AM  03/18/2016

## 2016-03-18 NOTE — H&P (Signed)
  H+P  Reviewed and will be scanned in later. No changes noted. 

## 2016-03-18 NOTE — Anesthesia Postprocedure Evaluation (Signed)
Anesthesia Post Note  Patient: Saverio DankerHolly Y Tony  Procedure(s) Performed: Procedure(s) (LRB): MYRINGOTOMY WITH TUBE PLACEMENT (Bilateral)  Patient location during evaluation: PACU Anesthesia Type: General Level of consciousness: awake and alert and oriented Pain management: satisfactory to patient Vital Signs Assessment: post-procedure vital signs reviewed and stable Respiratory status: spontaneous breathing, nonlabored ventilation and respiratory function stable Cardiovascular status: blood pressure returned to baseline and stable Postop Assessment: Adequate PO intake and No signs of nausea or vomiting Anesthetic complications: no    Cherly BeachStella, Tahesha Skeet J

## 2016-03-18 NOTE — Transfer of Care (Signed)
Immediate Anesthesia Transfer of Care Note  Patient: Sara Peck  Procedure(s) Performed: Procedure(s) with comments: MYRINGOTOMY WITH TUBE PLACEMENT (Bilateral) - BUTTERFLY TUBES  Patient Location: PACU  Anesthesia Type: General  Level of Consciousness: awake, alert  and patient cooperative  Airway and Oxygen Therapy: Patient Spontanous Breathing and Patient connected to supplemental oxygen  Post-op Assessment: Post-op Vital signs reviewed, Patient's Cardiovascular Status Stable, Respiratory Function Stable, Patent Airway and No signs of Nausea or vomiting  Post-op Vital Signs: Reviewed and stable  Complications: No apparent anesthesia complications

## 2016-03-18 NOTE — Anesthesia Preprocedure Evaluation (Signed)
Anesthesia Evaluation  Patient identified by MRN, date of birth, ID band  Reviewed: Allergy & Precautions, H&P , NPO status , Patient's Chart, lab work & pertinent test results  Airway Mallampati: II  TM Distance: >3 FB Neck ROM: full    Dental no notable dental hx.    Pulmonary Current Smoker,    Pulmonary exam normal        Cardiovascular  Rhythm:regular Rate:Normal     Neuro/Psych PSYCHIATRIC DISORDERS    GI/Hepatic   Endo/Other    Renal/GU Renal disease     Musculoskeletal   Abdominal   Peds  Hematology   Anesthesia Other Findings   Reproductive/Obstetrics                             Anesthesia Physical Anesthesia Plan  ASA: II  Anesthesia Plan: General   Post-op Pain Management:    Induction:   Airway Management Planned:   Additional Equipment:   Intra-op Plan:   Post-operative Plan:   Informed Consent: I have reviewed the patients History and Physical, chart, labs and discussed the procedure including the risks, benefits and alternatives for the proposed anesthesia with the patient or authorized representative who has indicated his/her understanding and acceptance.     Plan Discussed with: CRNA  Anesthesia Plan Comments:         Anesthesia Quick Evaluation

## 2016-03-18 NOTE — Anesthesia Procedure Notes (Signed)
Procedure Name: MAC Performed by: Collen Vincent Pre-anesthesia Checklist: Patient identified, Patient being monitored, Emergency Drugs available, Timeout performed and Suction available Patient Re-evaluated:Patient Re-evaluated prior to inductionOxygen Delivery Method: Circle system utilized Preoxygenation: Pre-oxygenation with 100% oxygen Intubation Type: Combination inhalational/ intravenous induction Ventilation: Mask ventilation without difficulty Dental Injury: Teeth and Oropharynx as per pre-operative assessment        

## 2016-03-21 ENCOUNTER — Encounter: Payer: Self-pay | Admitting: Unknown Physician Specialty

## 2016-04-08 DIAGNOSIS — G8929 Other chronic pain: Secondary | ICD-10-CM | POA: Diagnosis not present

## 2016-04-08 DIAGNOSIS — R1030 Lower abdominal pain, unspecified: Secondary | ICD-10-CM | POA: Diagnosis not present

## 2016-04-08 DIAGNOSIS — F908 Attention-deficit hyperactivity disorder, other type: Secondary | ICD-10-CM | POA: Diagnosis not present

## 2016-04-08 DIAGNOSIS — R109 Unspecified abdominal pain: Secondary | ICD-10-CM | POA: Diagnosis not present

## 2016-04-08 DIAGNOSIS — R319 Hematuria, unspecified: Secondary | ICD-10-CM | POA: Diagnosis not present

## 2016-04-14 DIAGNOSIS — H6983 Other specified disorders of Eustachian tube, bilateral: Secondary | ICD-10-CM | POA: Diagnosis not present

## 2016-05-19 DIAGNOSIS — N181 Chronic kidney disease, stage 1: Secondary | ICD-10-CM | POA: Diagnosis not present

## 2016-05-19 DIAGNOSIS — R109 Unspecified abdominal pain: Secondary | ICD-10-CM | POA: Diagnosis not present

## 2016-05-19 DIAGNOSIS — R319 Hematuria, unspecified: Secondary | ICD-10-CM | POA: Diagnosis not present

## 2016-05-19 DIAGNOSIS — F908 Attention-deficit hyperactivity disorder, other type: Secondary | ICD-10-CM | POA: Diagnosis not present

## 2016-05-19 DIAGNOSIS — N029 Recurrent and persistent hematuria with unspecified morphologic changes: Secondary | ICD-10-CM | POA: Diagnosis not present

## 2016-05-19 DIAGNOSIS — G8929 Other chronic pain: Secondary | ICD-10-CM | POA: Diagnosis not present

## 2016-05-19 DIAGNOSIS — N809 Endometriosis, unspecified: Secondary | ICD-10-CM | POA: Diagnosis not present

## 2016-06-09 DIAGNOSIS — Z79890 Hormone replacement therapy (postmenopausal): Secondary | ICD-10-CM | POA: Diagnosis not present

## 2016-06-09 DIAGNOSIS — L700 Acne vulgaris: Secondary | ICD-10-CM | POA: Diagnosis not present

## 2016-06-09 DIAGNOSIS — F411 Generalized anxiety disorder: Secondary | ICD-10-CM | POA: Diagnosis not present

## 2016-06-09 DIAGNOSIS — R319 Hematuria, unspecified: Secondary | ICD-10-CM | POA: Diagnosis not present

## 2016-06-10 DIAGNOSIS — G8929 Other chronic pain: Secondary | ICD-10-CM | POA: Diagnosis not present

## 2016-06-10 DIAGNOSIS — R319 Hematuria, unspecified: Secondary | ICD-10-CM | POA: Diagnosis not present

## 2016-06-10 DIAGNOSIS — R109 Unspecified abdominal pain: Secondary | ICD-10-CM | POA: Diagnosis not present

## 2016-06-16 DIAGNOSIS — F908 Attention-deficit hyperactivity disorder, other type: Secondary | ICD-10-CM | POA: Diagnosis not present

## 2016-06-16 DIAGNOSIS — R109 Unspecified abdominal pain: Secondary | ICD-10-CM | POA: Diagnosis not present

## 2016-06-16 DIAGNOSIS — R3129 Other microscopic hematuria: Secondary | ICD-10-CM | POA: Diagnosis not present

## 2016-06-16 DIAGNOSIS — R319 Hematuria, unspecified: Secondary | ICD-10-CM | POA: Diagnosis not present

## 2016-07-20 DIAGNOSIS — F908 Attention-deficit hyperactivity disorder, other type: Secondary | ICD-10-CM | POA: Diagnosis not present

## 2016-07-20 DIAGNOSIS — R109 Unspecified abdominal pain: Secondary | ICD-10-CM | POA: Diagnosis not present

## 2016-07-20 DIAGNOSIS — G8929 Other chronic pain: Secondary | ICD-10-CM | POA: Diagnosis not present

## 2016-07-20 DIAGNOSIS — R319 Hematuria, unspecified: Secondary | ICD-10-CM | POA: Diagnosis not present

## 2016-07-20 DIAGNOSIS — R1030 Lower abdominal pain, unspecified: Secondary | ICD-10-CM | POA: Diagnosis not present

## 2016-07-21 DIAGNOSIS — H6980 Other specified disorders of Eustachian tube, unspecified ear: Secondary | ICD-10-CM | POA: Diagnosis not present

## 2016-07-21 DIAGNOSIS — H6123 Impacted cerumen, bilateral: Secondary | ICD-10-CM | POA: Diagnosis not present

## 2016-08-18 DIAGNOSIS — Z1283 Encounter for screening for malignant neoplasm of skin: Secondary | ICD-10-CM | POA: Diagnosis not present

## 2016-08-18 DIAGNOSIS — Z808 Family history of malignant neoplasm of other organs or systems: Secondary | ICD-10-CM | POA: Diagnosis not present

## 2016-08-18 DIAGNOSIS — Z872 Personal history of diseases of the skin and subcutaneous tissue: Secondary | ICD-10-CM | POA: Diagnosis not present

## 2016-09-15 DIAGNOSIS — R319 Hematuria, unspecified: Secondary | ICD-10-CM | POA: Diagnosis not present

## 2016-09-15 DIAGNOSIS — R1030 Lower abdominal pain, unspecified: Secondary | ICD-10-CM | POA: Diagnosis not present

## 2016-09-15 DIAGNOSIS — G43009 Migraine without aura, not intractable, without status migrainosus: Secondary | ICD-10-CM | POA: Diagnosis not present

## 2016-09-15 DIAGNOSIS — F908 Attention-deficit hyperactivity disorder, other type: Secondary | ICD-10-CM | POA: Diagnosis not present

## 2016-10-27 DIAGNOSIS — R109 Unspecified abdominal pain: Secondary | ICD-10-CM | POA: Diagnosis not present

## 2016-10-27 DIAGNOSIS — F908 Attention-deficit hyperactivity disorder, other type: Secondary | ICD-10-CM | POA: Diagnosis not present

## 2016-10-27 DIAGNOSIS — G8929 Other chronic pain: Secondary | ICD-10-CM | POA: Diagnosis not present

## 2016-10-27 DIAGNOSIS — R319 Hematuria, unspecified: Secondary | ICD-10-CM | POA: Diagnosis not present

## 2016-10-27 DIAGNOSIS — G43009 Migraine without aura, not intractable, without status migrainosus: Secondary | ICD-10-CM | POA: Diagnosis not present

## 2016-12-02 DIAGNOSIS — Z0001 Encounter for general adult medical examination with abnormal findings: Secondary | ICD-10-CM | POA: Diagnosis not present

## 2016-12-02 DIAGNOSIS — Z79890 Hormone replacement therapy (postmenopausal): Secondary | ICD-10-CM | POA: Diagnosis not present

## 2016-12-02 DIAGNOSIS — F411 Generalized anxiety disorder: Secondary | ICD-10-CM | POA: Diagnosis not present

## 2016-12-08 DIAGNOSIS — H6123 Impacted cerumen, bilateral: Secondary | ICD-10-CM | POA: Diagnosis not present

## 2016-12-08 DIAGNOSIS — H6980 Other specified disorders of Eustachian tube, unspecified ear: Secondary | ICD-10-CM | POA: Diagnosis not present

## 2017-01-04 DIAGNOSIS — R109 Unspecified abdominal pain: Secondary | ICD-10-CM | POA: Diagnosis not present

## 2017-01-04 DIAGNOSIS — G8929 Other chronic pain: Secondary | ICD-10-CM | POA: Diagnosis not present

## 2017-01-04 DIAGNOSIS — F908 Attention-deficit hyperactivity disorder, other type: Secondary | ICD-10-CM | POA: Diagnosis not present

## 2017-01-04 DIAGNOSIS — R319 Hematuria, unspecified: Secondary | ICD-10-CM | POA: Diagnosis not present

## 2017-02-09 DIAGNOSIS — G8929 Other chronic pain: Secondary | ICD-10-CM | POA: Diagnosis not present

## 2017-02-09 DIAGNOSIS — F908 Attention-deficit hyperactivity disorder, other type: Secondary | ICD-10-CM | POA: Diagnosis not present

## 2017-02-09 DIAGNOSIS — R319 Hematuria, unspecified: Secondary | ICD-10-CM | POA: Diagnosis not present

## 2017-02-09 DIAGNOSIS — R109 Unspecified abdominal pain: Secondary | ICD-10-CM | POA: Diagnosis not present

## 2017-02-09 DIAGNOSIS — R1030 Lower abdominal pain, unspecified: Secondary | ICD-10-CM | POA: Diagnosis not present

## 2017-03-08 DIAGNOSIS — H6983 Other specified disorders of Eustachian tube, bilateral: Secondary | ICD-10-CM | POA: Diagnosis not present

## 2017-03-08 DIAGNOSIS — H9212 Otorrhea, left ear: Secondary | ICD-10-CM | POA: Diagnosis not present

## 2017-03-08 DIAGNOSIS — H6123 Impacted cerumen, bilateral: Secondary | ICD-10-CM | POA: Diagnosis not present

## 2017-04-12 DIAGNOSIS — R3129 Other microscopic hematuria: Secondary | ICD-10-CM | POA: Diagnosis not present

## 2017-04-12 DIAGNOSIS — G8929 Other chronic pain: Secondary | ICD-10-CM | POA: Diagnosis not present

## 2017-04-12 DIAGNOSIS — R319 Hematuria, unspecified: Secondary | ICD-10-CM | POA: Diagnosis not present

## 2017-04-12 DIAGNOSIS — N029 Recurrent and persistent hematuria with unspecified morphologic changes: Secondary | ICD-10-CM | POA: Diagnosis not present

## 2017-04-12 DIAGNOSIS — R109 Unspecified abdominal pain: Secondary | ICD-10-CM | POA: Diagnosis not present

## 2017-04-14 DIAGNOSIS — R319 Hematuria, unspecified: Secondary | ICD-10-CM | POA: Diagnosis not present

## 2017-04-14 DIAGNOSIS — G8929 Other chronic pain: Secondary | ICD-10-CM | POA: Diagnosis not present

## 2017-04-14 DIAGNOSIS — G43009 Migraine without aura, not intractable, without status migrainosus: Secondary | ICD-10-CM | POA: Diagnosis not present

## 2017-04-14 DIAGNOSIS — R109 Unspecified abdominal pain: Secondary | ICD-10-CM | POA: Diagnosis not present

## 2017-04-14 DIAGNOSIS — R1030 Lower abdominal pain, unspecified: Secondary | ICD-10-CM | POA: Diagnosis not present

## 2017-04-27 DIAGNOSIS — R319 Hematuria, unspecified: Secondary | ICD-10-CM | POA: Diagnosis not present

## 2017-04-27 DIAGNOSIS — F908 Attention-deficit hyperactivity disorder, other type: Secondary | ICD-10-CM | POA: Diagnosis not present

## 2017-04-27 DIAGNOSIS — R109 Unspecified abdominal pain: Secondary | ICD-10-CM | POA: Diagnosis not present

## 2017-04-27 DIAGNOSIS — G8929 Other chronic pain: Secondary | ICD-10-CM | POA: Diagnosis not present

## 2017-05-22 DIAGNOSIS — L700 Acne vulgaris: Secondary | ICD-10-CM | POA: Diagnosis not present

## 2017-05-22 DIAGNOSIS — D1800 Hemangioma unspecified site: Secondary | ICD-10-CM | POA: Diagnosis not present

## 2017-05-22 DIAGNOSIS — Z808 Family history of malignant neoplasm of other organs or systems: Secondary | ICD-10-CM | POA: Diagnosis not present

## 2017-05-22 DIAGNOSIS — Z86018 Personal history of other benign neoplasm: Secondary | ICD-10-CM | POA: Diagnosis not present

## 2017-06-02 DIAGNOSIS — F411 Generalized anxiety disorder: Secondary | ICD-10-CM | POA: Diagnosis not present

## 2017-06-02 DIAGNOSIS — F17210 Nicotine dependence, cigarettes, uncomplicated: Secondary | ICD-10-CM | POA: Diagnosis not present

## 2017-06-02 DIAGNOSIS — Z79890 Hormone replacement therapy (postmenopausal): Secondary | ICD-10-CM | POA: Diagnosis not present

## 2017-07-04 DIAGNOSIS — R319 Hematuria, unspecified: Secondary | ICD-10-CM | POA: Diagnosis not present

## 2017-07-04 DIAGNOSIS — R1030 Lower abdominal pain, unspecified: Secondary | ICD-10-CM | POA: Diagnosis not present

## 2017-07-04 DIAGNOSIS — R109 Unspecified abdominal pain: Secondary | ICD-10-CM | POA: Diagnosis not present

## 2017-07-04 DIAGNOSIS — G8929 Other chronic pain: Secondary | ICD-10-CM | POA: Diagnosis not present

## 2017-07-04 DIAGNOSIS — F908 Attention-deficit hyperactivity disorder, other type: Secondary | ICD-10-CM | POA: Diagnosis not present

## 2017-07-14 DIAGNOSIS — R109 Unspecified abdominal pain: Secondary | ICD-10-CM | POA: Diagnosis not present

## 2017-07-14 DIAGNOSIS — G8929 Other chronic pain: Secondary | ICD-10-CM | POA: Diagnosis not present

## 2017-07-14 DIAGNOSIS — N029 Recurrent and persistent hematuria with unspecified morphologic changes: Secondary | ICD-10-CM | POA: Diagnosis not present

## 2017-07-14 DIAGNOSIS — R3129 Other microscopic hematuria: Secondary | ICD-10-CM | POA: Diagnosis not present

## 2017-07-14 DIAGNOSIS — R319 Hematuria, unspecified: Secondary | ICD-10-CM | POA: Diagnosis not present

## 2017-08-22 DIAGNOSIS — F908 Attention-deficit hyperactivity disorder, other type: Secondary | ICD-10-CM | POA: Diagnosis not present

## 2017-08-22 DIAGNOSIS — G8929 Other chronic pain: Secondary | ICD-10-CM | POA: Diagnosis not present

## 2017-08-22 DIAGNOSIS — R319 Hematuria, unspecified: Secondary | ICD-10-CM | POA: Diagnosis not present

## 2017-08-22 DIAGNOSIS — M545 Low back pain: Secondary | ICD-10-CM | POA: Diagnosis not present

## 2017-08-22 DIAGNOSIS — R109 Unspecified abdominal pain: Secondary | ICD-10-CM | POA: Diagnosis not present

## 2017-09-21 DIAGNOSIS — H6983 Other specified disorders of Eustachian tube, bilateral: Secondary | ICD-10-CM | POA: Diagnosis not present

## 2017-09-21 DIAGNOSIS — H6123 Impacted cerumen, bilateral: Secondary | ICD-10-CM | POA: Diagnosis not present

## 2017-09-21 DIAGNOSIS — H9213 Otorrhea, bilateral: Secondary | ICD-10-CM | POA: Diagnosis not present

## 2017-10-26 DIAGNOSIS — G8929 Other chronic pain: Secondary | ICD-10-CM | POA: Diagnosis not present

## 2017-10-26 DIAGNOSIS — M545 Low back pain: Secondary | ICD-10-CM | POA: Diagnosis not present

## 2017-10-26 DIAGNOSIS — R319 Hematuria, unspecified: Secondary | ICD-10-CM | POA: Diagnosis not present

## 2017-10-26 DIAGNOSIS — R1030 Lower abdominal pain, unspecified: Secondary | ICD-10-CM | POA: Diagnosis not present

## 2017-10-26 DIAGNOSIS — F908 Attention-deficit hyperactivity disorder, other type: Secondary | ICD-10-CM | POA: Diagnosis not present

## 2017-10-26 DIAGNOSIS — R109 Unspecified abdominal pain: Secondary | ICD-10-CM | POA: Diagnosis not present

## 2017-12-08 ENCOUNTER — Other Ambulatory Visit: Payer: Self-pay | Admitting: Nurse Practitioner

## 2017-12-08 DIAGNOSIS — N958 Other specified menopausal and perimenopausal disorders: Secondary | ICD-10-CM

## 2017-12-08 MED ORDER — EST ESTROGENS-METHYLTEST 1.25-2.5 MG PO TABS: 1.0000 | ORAL_TABLET | Freq: Every day | ORAL | 5 refills | Status: DC

## 2017-12-15 ENCOUNTER — Encounter: Payer: Self-pay | Admitting: Nurse Practitioner

## 2017-12-15 ENCOUNTER — Ambulatory Visit (INDEPENDENT_AMBULATORY_CARE_PROVIDER_SITE_OTHER): Payer: BLUE CROSS/BLUE SHIELD | Admitting: Nurse Practitioner

## 2017-12-15 VITALS — BP 106/72 | HR 95 | Resp 16 | Ht 65.5 in | Wt 137.0 lb

## 2017-12-15 DIAGNOSIS — Z0001 Encounter for general adult medical examination with abnormal findings: Secondary | ICD-10-CM

## 2017-12-15 DIAGNOSIS — R300 Dysuria: Secondary | ICD-10-CM | POA: Diagnosis not present

## 2017-12-15 DIAGNOSIS — N958 Other specified menopausal and perimenopausal disorders: Secondary | ICD-10-CM | POA: Diagnosis not present

## 2017-12-15 DIAGNOSIS — F411 Generalized anxiety disorder: Secondary | ICD-10-CM | POA: Diagnosis not present

## 2017-12-15 MED ORDER — EST ESTROGENS-METHYLTEST 1.25-2.5 MG PO TABS: 1.0000 | ORAL_TABLET | Freq: Every day | ORAL | 5 refills | Status: DC

## 2017-12-15 MED ORDER — CLORAZEPATE DIPOTASSIUM 7.5 MG PO TABS: ORAL_TABLET | ORAL | 3 refills | Status: DC

## 2017-12-15 NOTE — Progress Notes (Signed)
[facility] [address] [place], [place] [address]  Internal MEDICINE  Office Visit Note  Patient Name: Sara Peck  [id number]  [id number]  Date of Service: 12/15/2017  Chief Complaint  Patient presents with  . Annual Exam    needs to have new rx of hormones sent to [facility] in [place].      The patient is here for annual wellness visit. She is seen by pain management for chronic pain related to [place] issues. She gets prescription for pain medication as well as medication for attention deficit disorder. She has no new concerns or complaints today.    Pt is here for routine health maintenance examination  Current Medication: Outpatient Encounter Medications as of 12/15/2017  Medication Sig  . amphetamine-dextroamphetamine (ADDERALL) 10 MG tablet TAKE 1-2 TABS IN THE MORNING AND 1 TABLET AT NOON  . clorazepate (TRANXENE) 7.5 MG tablet TAKE 1 TABLET BY MOUTH TWICE A DAY AS NEEDED FOR ANXIETY  . docusate sodium (COLACE) 100 MG capsule Take 100 mg by mouth 2 (two) times daily as needed for mild constipation.  Marland Kitchen estrogens-methylTEST (ESTRATEST) 1.25-2.5 MG tablet Take 1 tablet by mouth daily.  Marland Kitchen lisinopril (PRINIVIL,ZESTRIL) 5 MG tablet Take 5 mg by mouth daily.  . Oxycodone HCl 10 MG TABS Take 10 mg by mouth every 4 (four) hours as needed (every 4-6 hours PRN.  Pt usually uses 3 times/day).  . Probiotic Product (PROBIOTIC PO) Take by mouth daily.  . [DISCONTINUED] clorazepate (TRANXENE) 7.5 MG tablet TAKE 1 TABLET BY MOUTH TWICE A DAY AS NEEDED FOR ANXIETY  . [DISCONTINUED] estrogens-methylTEST (ESTRATEST) 1.25-2.5 MG tablet Take 1 tablet by mouth daily.  . [DISCONTINUED] amphetamine-dextroamphetamine (ADDERALL) 15 MG tablet Take 15 mg by mouth daily.   No facility-administered encounter medications on file as of 12/15/2017.     Surgical History: Past Surgical History:  Procedure Laterality Date  . ABDOMINAL HYSTERECTOMY  12-27-13  . APPENDECTOMY  12-27-13  .  COLONOSCOPY  2014   [facility]  . LAPAROSCOPY     multiple  . MYRINGOTOMY WITH TUBE PLACEMENT Bilateral 03/18/2016   Procedure: MYRINGOTOMY WITH TUBE PLACEMENT;  Surgeon: Linus Salmons, MD;  Location: [facility];  Service: ENT;  Laterality: Bilateral;  BUTTERFLY TUBES  . NASAL ENDOSCOPY  2014   [facility]  . PARTIAL HYSTERECTOMY  Jan 2012   with left ovary/tube  . SKIN LESION EXCISION  2015   back  . TYMPANOSTOMY TUBE PLACEMENT  Jan 2012    Medical History: Past Medical History:  Diagnosis Date  . ADD (attention deficit disorder)   . Chronic kidney disease   . Endometriosis   . Headache    rare migraines.  stress HA more often  . Right flank pain    Se[facility] Linton [facility] at [facility]  . Thin basement membrane disease    sees Lisbeth Ply, MD at [facility]    Family History: Family History  Problem Relation Age of Onset  . Cancer Mother        thyroid      Review of Systems  Constitutional: Negative for activity change, chills, fatigue and unexpected weight change.  HENT: Negative for congestion, postnasal drip, rhinorrhea, sneezing and sore throat.   Eyes: Negative for redness.  Respiratory: Negative for cough, chest tightness and shortness of breath.   Cardiovascular: Negative for chest pain and palpitations.  Gastrointestinal: Negative for abdominal pain, constipation, diarrhea, nausea and vomiting.  Endocrine: Negative for cold intolerance, heat intolerance, polydipsia,  polyphagia and polyuria.  Genitourinary: Positive for hematuria. Negative for dysuria and frequency.  Musculoskeletal: Positive for back pain. Negative for arthralgias, joint swelling and neck pain.  Skin: Negative for color change, pallor, rash and wound.  Allergic/Immunologic: Negative for environmental allergies.  Neurological: Negative for tremors, numbness and headaches.  Hematological: Negative for adenopathy. Does not bruise/bleed easily.  Psychiatric/Behavioral: Negative for behavioral problems  (Depression), sleep disturbance and suicidal ideas. The patient is nervous/anxious.      Today's Vitals   12/15/17 0859  BP: 106/72  Pulse: 95  Resp: 16  SpO2: 100%  Weight: 137 lb (62.1 kg)  Height: 5' 5.5" (1.664 m)    Physical Exam  Constitutional: She is oriented to person, place, and time. She appears well-developed and well-nourished. No distress.  HENT:  Head: Normocephalic and atraumatic.  Mouth/Throat: Oropharynx is clear and moist. No oropharyngeal exudate.  Eyes: EOM are normal. Pupils are equal, round, and reactive to light.  Neck: Normal range of motion. Neck supple. No JVD present. No tracheal deviation present. No thyromegaly present.  Cardiovascular: Normal rate, regular rhythm and normal heart sounds. Exam reveals no gallop and no friction rub.  No murmur heard. Pulmonary/Chest: Effort normal and breath sounds normal. No respiratory distress. She has no rales. She exhibits no tenderness.  Abdominal: Soft. Bowel sounds are normal. There is no tenderness.  Musculoskeletal: Normal range of motion.  Lymphadenopathy:    She has no cervical adenopathy.  Neurological: She is alert and oriented to person, place, and time. No cranial nerve deficit.  Skin: Skin is warm and dry. She is not diaphoretic.  Psychiatric: She has a normal mood and affect. Her behavior is normal. Judgment and thought content normal.  Nursing note and vitals reviewed.  Assessment/Plan: 1. Encounter for general adult medical examination with abnormal findings Annual health maintenance exam performed today  2. Artificial menopause - estrogens-methylTEST (ESTRATEST) 1.25-2.5 MG tablet; Take 1 tablet by mouth daily.  Dispense: 30 tablet; Refill: 5  3. GAD (generalized anxiety disorder) - clorazepate (TRANXENE) 7.5 MG tablet; TAKE 1 TABLET BY MOUTH TWICE A DAY AS NEEDED FOR ANXIETY  Dispense: 60 tablet; Refill: 3  4. Dysuria - Urinalysis, Routine w reflex microscopic   General Counseling:  Stacyann verbalizes understanding of the findings of todays visit and agrees with plan of treatment. I have discussed any further diagnostic evaluation that may be needed or ordered today. We also reviewed her medications today. she has been encouraged to call the office with any questions or concerns that should arise related to todays visit.   This patient was seen by Vincent GrosHeather Jamecia Lerman, FNP- C in Collaboration with Dr Lyndon CodeFozia M Khan as a part of collaborative care agreement    Orders Placed This Encounter  Procedures  . Urinalysis, Routine w reflex microscopic    Meds ordered this encounter  Medications  . clorazepate (TRANXENE) 7.5 MG tablet    Sig: TAKE 1 TABLET BY MOUTH TWICE A DAY AS NEEDED FOR ANXIETY    Dispense:  60 tablet    Refill:  3    Order Specific Question:   Supervising Provider    Answer:   Lyndon CodeKHAN, FOZIA M [[id number]]  . estrogens-methylTEST (ESTRATEST) 1.25-2.5 MG tablet    Sig: Take 1 tablet by mouth daily.    Dispense:  30 tablet    Refill:  5    Order Specific Question:   Supervising Provider    Answer:   Lyndon CodeKHAN, FOZIA M [[id number]]    Time spent:  30 Minutes   Lyndon Code, MD  Internal Medicine

## 2017-12-16 LAB — URINALYSIS, ROUTINE W REFLEX MICROSCOPIC
BILIRUBIN UA: NEGATIVE
Glucose, UA: NEGATIVE
Ketones, UA: NEGATIVE
LEUKOCYTES UA: NEGATIVE
Nitrite, UA: NEGATIVE
PH UA: 5 (ref 5.0–7.5)
PROTEIN UA: NEGATIVE
RBC UA: NEGATIVE
Specific Gravity, UA: 1.014 (ref 1.005–1.030)
Urobilinogen, Ur: 0.2 mg/dL (ref 0.2–1.0)

## 2018-01-02 DIAGNOSIS — M545 Low back pain: Secondary | ICD-10-CM | POA: Diagnosis not present

## 2018-01-02 DIAGNOSIS — N029 Recurrent and persistent hematuria with unspecified morphologic changes: Secondary | ICD-10-CM | POA: Diagnosis not present

## 2018-01-02 DIAGNOSIS — R109 Unspecified abdominal pain: Secondary | ICD-10-CM | POA: Diagnosis not present

## 2018-01-02 DIAGNOSIS — N398 Other specified disorders of urinary system: Secondary | ICD-10-CM | POA: Diagnosis not present

## 2018-01-02 DIAGNOSIS — R319 Hematuria, unspecified: Secondary | ICD-10-CM | POA: Diagnosis not present

## 2018-01-02 DIAGNOSIS — G8929 Other chronic pain: Secondary | ICD-10-CM | POA: Diagnosis not present

## 2018-01-16 DIAGNOSIS — M545 Low back pain: Secondary | ICD-10-CM | POA: Diagnosis not present

## 2018-01-16 DIAGNOSIS — R3129 Other microscopic hematuria: Secondary | ICD-10-CM | POA: Diagnosis not present

## 2018-01-16 DIAGNOSIS — R109 Unspecified abdominal pain: Secondary | ICD-10-CM | POA: Diagnosis not present

## 2018-01-16 DIAGNOSIS — G8929 Other chronic pain: Secondary | ICD-10-CM | POA: Diagnosis not present

## 2018-01-16 DIAGNOSIS — N029 Recurrent and persistent hematuria with unspecified morphologic changes: Secondary | ICD-10-CM | POA: Diagnosis not present

## 2018-01-16 DIAGNOSIS — Z9189 Other specified personal risk factors, not elsewhere classified: Secondary | ICD-10-CM | POA: Diagnosis not present

## 2018-01-16 DIAGNOSIS — R319 Hematuria, unspecified: Secondary | ICD-10-CM | POA: Diagnosis not present

## 2018-02-16 DIAGNOSIS — M545 Low back pain: Secondary | ICD-10-CM | POA: Diagnosis not present

## 2018-02-16 DIAGNOSIS — N029 Recurrent and persistent hematuria with unspecified morphologic changes: Secondary | ICD-10-CM | POA: Diagnosis not present

## 2018-02-16 DIAGNOSIS — G43009 Migraine without aura, not intractable, without status migrainosus: Secondary | ICD-10-CM | POA: Diagnosis not present

## 2018-02-16 DIAGNOSIS — F908 Attention-deficit hyperactivity disorder, other type: Secondary | ICD-10-CM | POA: Diagnosis not present

## 2018-02-16 DIAGNOSIS — G894 Chronic pain syndrome: Secondary | ICD-10-CM | POA: Diagnosis not present

## 2018-02-16 DIAGNOSIS — R319 Hematuria, unspecified: Secondary | ICD-10-CM | POA: Diagnosis not present

## 2018-03-14 DIAGNOSIS — H6980 Other specified disorders of Eustachian tube, unspecified ear: Secondary | ICD-10-CM | POA: Diagnosis not present

## 2018-03-14 DIAGNOSIS — H6123 Impacted cerumen, bilateral: Secondary | ICD-10-CM | POA: Diagnosis not present

## 2018-05-23 DIAGNOSIS — G894 Chronic pain syndrome: Secondary | ICD-10-CM | POA: Diagnosis not present

## 2018-05-23 DIAGNOSIS — M545 Low back pain: Secondary | ICD-10-CM | POA: Diagnosis not present

## 2018-05-23 DIAGNOSIS — R319 Hematuria, unspecified: Secondary | ICD-10-CM | POA: Diagnosis not present

## 2018-05-23 DIAGNOSIS — N029 Recurrent and persistent hematuria with unspecified morphologic changes: Secondary | ICD-10-CM | POA: Diagnosis not present

## 2018-05-23 DIAGNOSIS — F908 Attention-deficit hyperactivity disorder, other type: Secondary | ICD-10-CM | POA: Diagnosis not present

## 2018-05-28 DIAGNOSIS — R319 Hematuria, unspecified: Secondary | ICD-10-CM | POA: Diagnosis not present

## 2018-05-28 DIAGNOSIS — M545 Low back pain: Secondary | ICD-10-CM | POA: Diagnosis not present

## 2018-06-14 ENCOUNTER — Ambulatory Visit: Payer: Self-pay | Admitting: Nurse Practitioner

## 2018-07-03 ENCOUNTER — Ambulatory Visit: Payer: Self-pay | Admitting: Nurse Practitioner

## 2018-07-13 ENCOUNTER — Encounter: Payer: Self-pay | Admitting: Nurse Practitioner

## 2018-07-13 ENCOUNTER — Ambulatory Visit: Payer: Self-pay | Admitting: Nurse Practitioner

## 2018-07-13 VITALS — BP 118/75 | HR 81 | Resp 16 | Ht 65.5 in | Wt 143.6 lb

## 2018-07-13 DIAGNOSIS — N390 Urinary tract infection, site not specified: Secondary | ICD-10-CM

## 2018-07-13 DIAGNOSIS — N958 Other specified menopausal and perimenopausal disorders: Secondary | ICD-10-CM | POA: Diagnosis not present

## 2018-07-13 DIAGNOSIS — F411 Generalized anxiety disorder: Secondary | ICD-10-CM | POA: Diagnosis not present

## 2018-07-13 DIAGNOSIS — R319 Hematuria, unspecified: Secondary | ICD-10-CM

## 2018-07-13 DIAGNOSIS — R300 Dysuria: Secondary | ICD-10-CM

## 2018-07-13 MED ORDER — CIPROFLOXACIN HCL 500 MG PO TABS: 500.0000 mg | ORAL_TABLET | Freq: Two times a day (BID) | ORAL | 0 refills | Status: DC

## 2018-07-13 MED ORDER — EST ESTROGENS-METHYLTEST 1.25-2.5 MG PO TABS: 1.0000 | ORAL_TABLET | Freq: Every day | ORAL | 5 refills | Status: DC

## 2018-07-13 NOTE — Progress Notes (Signed)
[facility] [address] [place], [place] [address]  Internal MEDICINE  Office Visit Note  Patient Name: Sara Peck  [id number]  [id number]  Date of Service: 07/22/2018  Chief Complaint  Patient presents with  . Urinary Tract Infection    episodes of frequency and dysuria     She is seen by pain management for chronic pain related to [place] issues. She gets prescription for pain medication as well as medication for attention deficit disorder. She has no new concerns or complaints today.     Urinary Tract Infection   This is a recurrent problem. The current episode started in the past 7 days. The problem occurs intermittently. The problem has been unchanged. The quality of the pain is described as burning. The pain is mild. There has been no fever. She is sexually active. There is no history of pyelonephritis. Associated symptoms include flank pain, frequency and hematuria. Pertinent negatives include no chills, nausea or vomiting. She has tried acetaminophen for the symptoms. The treatment provided mild relief. Her past medical history is significant for a urological procedure.       Current Medication: Outpatient Encounter Medications as of 07/13/2018  Medication Sig  . clorazepate (TRANXENE) 7.5 MG tablet TAKE 1 TABLET BY MOUTH TWICE A DAY AS NEEDED FOR ANXIETY  . docusate sodium (COLACE) 100 MG capsule Take 100 mg by mouth 2 (two) times daily as needed for mild constipation.  Marland Kitchen estrogens-methylTEST (ESTRATEST) 1.25-2.5 MG tablet Take 1 tablet by mouth daily.  Marland Kitchen lisinopril (PRINIVIL,ZESTRIL) 5 MG tablet Take 5 mg by mouth daily.  . Oxycodone HCl 10 MG TABS Take 10 mg by mouth every 4 (four) hours as needed (every 4-6 hours PRN.  Pt usually uses 3 times/day).  . [DISCONTINUED] estrogens-methylTEST (ESTRATEST) 1.25-2.5 MG tablet Take 1 tablet by mouth daily.  Marland Kitchen amphetamine-dextroamphetamine (ADDERALL) 10 MG tablet TAKE 1-2 TABS IN THE MORNING AND 1 TABLET AT NOON   . ciprofloxacin (CIPRO) 500 MG tablet Take 1 tablet (500 mg total) by mouth 2 (two) times daily.  . [DISCONTINUED] Probiotic Product (PROBIOTIC PO) Take by mouth daily.   No facility-administered encounter medications on file as of 07/13/2018.     Surgical History: Past Surgical History:  Procedure Laterality Date  . ABDOMINAL HYSTERECTOMY  12-27-13  . APPENDECTOMY  12-27-13  . COLONOSCOPY  2014   [facility]  . LAPAROSCOPY     multiple  . MYRINGOTOMY WITH TUBE PLACEMENT Bilateral 03/18/2016   Procedure: MYRINGOTOMY WITH TUBE PLACEMENT;  Surgeon: Linus Salmons, MD;  Location: [facility];  Service: ENT;  Laterality: Bilateral;  BUTTERFLY TUBES  . NASAL ENDOSCOPY  2014   [facility]  . PARTIAL HYSTERECTOMY  Jan 2012   with left ovary/tube  . SKIN LESION EXCISION  2015   back  . TYMPANOSTOMY TUBE PLACEMENT  Jan 2012    Medical History: Past Medical History:  Diagnosis Date  . ADD (attention deficit disorder)   . Chronic kidney disease   . Endometriosis   . Headache    rare migraines.  stress HA more often  . Right flank pain    Se[facility] Linton [facility] at [facility]  . Thin basement membrane disease    sees Lisbeth Ply, MD at [facility]    Family History: Family History  Problem Relation Age of Onset  . Cancer Mother        thyroid    Social History   Socioeconomic History  . Marital status: Married  Spouse name: Not on file  . Number of children: Not on file  . Years of education: Not on file  . Highest education level: Not on file  Occupational History  . Not on file  Social Needs  . Financial resource strain: Not on file  . Food insecurity:    Worry: Not on file    Inability: Not on file  . Transportation needs:    Medical: Not on file    Non-medical: Not on file  Tobacco Use  . Smoking status: Former Smoker    Packs/day: 0.50    Years: 5.00    Pack years: 2.50  . Smokeless tobacco: Never Used  Substance and Sexual Activity  . Alcohol use: Yes    Comment:  occasionally beer (holidays, special occasions)  . Drug use: No  . Sexual activity: Not on file  Lifestyle  . Physical activity:    Days per week: Not on file    Minutes per session: Not on file  . Stress: Not on file  Relationships  . Social connections:    Talks on phone: Not on file    Gets together: Not on file    Attends religious service: Not on file    Active member of club or organization: Not on file    Attends meetings of clubs or organizations: Not on file    Relationship status: Not on file  . Intimate partner violence:    Fear of current or ex partner: Not on file    Emotionally abused: Not on file    Physically abused: Not on file    Forced sexual activity: Not on file  Other Topics Concern  . Not on file  Social History Narrative  . Not on file      Review of Systems  Constitutional: Negative for activity change, chills, fatigue and unexpected weight change.  HENT: Negative for congestion, postnasal drip, rhinorrhea, sneezing and sore throat.   Eyes: Negative.  Negative for redness.  Respiratory: Negative for cough, chest tightness and shortness of breath.   Cardiovascular: Negative for chest pain and palpitations.  Gastrointestinal: Negative for abdominal pain, constipation, diarrhea, nausea and vomiting.  Endocrine: Negative for cold intolerance, heat intolerance, polydipsia, polyphagia and polyuria.  Genitourinary: Positive for flank pain, frequency and hematuria. Negative for dysuria.  Musculoskeletal: Positive for back pain. Negative for arthralgias, joint swelling and neck pain.  Skin: Negative for color change, pallor, rash and wound.  Allergic/Immunologic: Negative for environmental allergies.  Neurological: Negative for dizziness, tremors, numbness and headaches.  Hematological: Negative for adenopathy. Does not bruise/bleed easily.  Psychiatric/Behavioral: Negative for behavioral problems (Depression), sleep disturbance and suicidal ideas. The  patient is nervous/anxious.     Today's Vitals   07/13/18 1036  BP: 118/75  Pulse: 81  Resp: 16  SpO2: 100%  Weight: 143 lb 9.6 oz (65.1 kg)  Height: 5' 5.5" (1.664 m)    Physical Exam  Constitutional: She is oriented to person, place, and time. She appears well-developed and well-nourished. No distress.  HENT:  Head: Normocephalic and atraumatic.  Nose: Nose normal.  Mouth/Throat: Oropharynx is clear and moist. No oropharyngeal exudate.  Eyes: Pupils are equal, round, and reactive to light. Conjunctivae and EOM are normal.  Neck: Normal range of motion. Neck supple. No JVD present. No tracheal deviation present. No thyromegaly present.  Cardiovascular: Normal rate, regular rhythm and normal heart sounds. Exam reveals no gallop and no friction rub.  No murmur heard. Pulmonary/Chest: Effort normal and breath  sounds normal. No respiratory distress. She has no rales. She exhibits no tenderness.  Abdominal: Soft. Bowel sounds are normal. There is no tenderness.  Genitourinary:  Genitourinary Comments: Urine sample positive for large blood  Musculoskeletal: Normal range of motion.  Lymphadenopathy:    She has no cervical adenopathy.  Neurological: She is alert and oriented to person, place, and time. No cranial nerve deficit.  Skin: Skin is warm and dry. She is not diaphoretic.  Psychiatric: She has a normal mood and affect. Her behavior is normal. Judgment and thought content normal.  Nursing note and vitals reviewed.  Assessment/Plan: 1. Urinary tract infection with hematuria, site unspecified Start cipro 500mg  twice daily for 7 days. Send urine for culture and sensitivity and adjust antibiotic therapy as indicated. - ciprofloxacin (CIPRO) 500 MG tablet; Take 1 tablet (500 mg total) by mouth 2 (two) times daily.  Dispense: 14 tablet; Refill: 0  2. Dysuria - POCT Urinalysis Dipstick - CULTURE, URINE COMPREHENSIVE  3. Artificial menopause Continue with hormone therapy as  initiated per her GYN provider. Refills provided today.  - estrogens-methylTEST (ESTRATEST) 1.25-2.5 MG tablet; Take 1 tablet by mouth daily.  Dispense: 30 tablet; Refill: 5  4. GAD (generalized anxiety disorder) May continue to use tranzene as needed and as prescribed.   General Counseling: Sincerity verbalizes understanding of the findings of todays visit and agrees with plan of treatment. I have discussed any further diagnostic evaluation that may be needed or ordered today. We also reviewed her medications today. she has been encouraged to call the office with any questions or concerns that should arise related to todays visit.  This patient was seen by Vincent Gros FNP Collaboration with Dr Lyndon Code as a part of collaborative care agreement  Orders Placed This Encounter  Procedures  . CULTURE, URINE COMPREHENSIVE  . POCT Urinalysis Dipstick    Meds ordered this encounter  Medications  . ciprofloxacin (CIPRO) 500 MG tablet    Sig: Take 1 tablet (500 mg total) by mouth 2 (two) times daily.    Dispense:  14 tablet    Refill:  0    Order Specific Question:   Supervising Provider    Answer:   Lyndon Code [[id number]]  . estrogens-methylTEST (ESTRATEST) 1.25-2.5 MG tablet    Sig: Take 1 tablet by mouth daily.    Dispense:  30 tablet    Refill:  5    Order Specific Question:   Supervising Provider    Answer:   Lyndon Code [[id number]]    Time spent: 54 Minutes      Dr Lyndon Code Internal medicine

## 2018-07-15 LAB — CULTURE, URINE COMPREHENSIVE

## 2018-07-16 DIAGNOSIS — H6123 Impacted cerumen, bilateral: Secondary | ICD-10-CM | POA: Diagnosis not present

## 2018-07-16 DIAGNOSIS — R0982 Postnasal drip: Secondary | ICD-10-CM | POA: Diagnosis not present

## 2018-07-16 DIAGNOSIS — H6980 Other specified disorders of Eustachian tube, unspecified ear: Secondary | ICD-10-CM | POA: Diagnosis not present

## 2018-07-18 DIAGNOSIS — D485 Neoplasm of uncertain behavior of skin: Secondary | ICD-10-CM | POA: Diagnosis not present

## 2018-07-18 DIAGNOSIS — K130 Diseases of lips: Secondary | ICD-10-CM | POA: Diagnosis not present

## 2018-07-18 DIAGNOSIS — L578 Other skin changes due to chronic exposure to nonionizing radiation: Secondary | ICD-10-CM | POA: Diagnosis not present

## 2018-07-18 DIAGNOSIS — L814 Other melanin hyperpigmentation: Secondary | ICD-10-CM | POA: Diagnosis not present

## 2018-07-18 DIAGNOSIS — Z1283 Encounter for screening for malignant neoplasm of skin: Secondary | ICD-10-CM | POA: Diagnosis not present

## 2018-07-22 DIAGNOSIS — N390 Urinary tract infection, site not specified: Secondary | ICD-10-CM | POA: Insufficient documentation

## 2018-07-22 DIAGNOSIS — R319 Hematuria, unspecified: Secondary | ICD-10-CM

## 2018-07-22 DIAGNOSIS — R300 Dysuria: Secondary | ICD-10-CM | POA: Insufficient documentation

## 2018-07-24 DIAGNOSIS — N029 Recurrent and persistent hematuria with unspecified morphologic changes: Secondary | ICD-10-CM | POA: Diagnosis not present

## 2018-07-24 DIAGNOSIS — R319 Hematuria, unspecified: Secondary | ICD-10-CM | POA: Diagnosis not present

## 2018-07-24 DIAGNOSIS — R3129 Other microscopic hematuria: Secondary | ICD-10-CM | POA: Diagnosis not present

## 2018-07-24 DIAGNOSIS — M545 Low back pain: Secondary | ICD-10-CM | POA: Diagnosis not present

## 2018-07-24 LAB — POCT URINALYSIS DIPSTICK
Bilirubin, UA: NEGATIVE
Glucose, UA: NEGATIVE
KETONES UA: NEGATIVE
LEUKOCYTES UA: NEGATIVE
Nitrite, UA: NEGATIVE
PH UA: 6 (ref 5.0–8.0)
PROTEIN UA: NEGATIVE
UROBILINOGEN UA: 0.2 U/dL

## 2018-08-02 DIAGNOSIS — R319 Hematuria, unspecified: Secondary | ICD-10-CM | POA: Diagnosis not present

## 2018-08-02 DIAGNOSIS — G43009 Migraine without aura, not intractable, without status migrainosus: Secondary | ICD-10-CM | POA: Diagnosis not present

## 2018-08-02 DIAGNOSIS — F908 Attention-deficit hyperactivity disorder, other type: Secondary | ICD-10-CM | POA: Diagnosis not present

## 2018-08-02 DIAGNOSIS — M545 Low back pain: Secondary | ICD-10-CM | POA: Diagnosis not present

## 2018-10-23 DIAGNOSIS — C44519 Basal cell carcinoma of skin of other part of trunk: Secondary | ICD-10-CM | POA: Diagnosis not present

## 2018-10-23 DIAGNOSIS — Z85828 Personal history of other malignant neoplasm of skin: Secondary | ICD-10-CM | POA: Insufficient documentation

## 2018-11-02 DIAGNOSIS — H6121 Impacted cerumen, right ear: Secondary | ICD-10-CM | POA: Diagnosis not present

## 2018-11-02 DIAGNOSIS — H6980 Other specified disorders of Eustachian tube, unspecified ear: Secondary | ICD-10-CM | POA: Diagnosis not present

## 2018-11-13 DIAGNOSIS — H9210 Otorrhea, unspecified ear: Secondary | ICD-10-CM | POA: Diagnosis not present

## 2018-11-21 DIAGNOSIS — G43009 Migraine without aura, not intractable, without status migrainosus: Secondary | ICD-10-CM | POA: Diagnosis not present

## 2018-11-21 DIAGNOSIS — R319 Hematuria, unspecified: Secondary | ICD-10-CM | POA: Diagnosis not present

## 2018-11-21 DIAGNOSIS — M545 Low back pain: Secondary | ICD-10-CM | POA: Diagnosis not present

## 2018-11-21 DIAGNOSIS — F908 Attention-deficit hyperactivity disorder, other type: Secondary | ICD-10-CM | POA: Diagnosis not present

## 2018-11-21 DIAGNOSIS — G894 Chronic pain syndrome: Secondary | ICD-10-CM | POA: Diagnosis not present

## 2018-11-27 DIAGNOSIS — H9210 Otorrhea, unspecified ear: Secondary | ICD-10-CM | POA: Diagnosis not present

## 2018-11-27 DIAGNOSIS — J019 Acute sinusitis, unspecified: Secondary | ICD-10-CM | POA: Diagnosis not present

## 2018-11-27 DIAGNOSIS — R0982 Postnasal drip: Secondary | ICD-10-CM | POA: Diagnosis not present

## 2019-01-11 ENCOUNTER — Encounter: Payer: Self-pay | Admitting: Nurse Practitioner

## 2019-01-11 ENCOUNTER — Other Ambulatory Visit: Payer: Self-pay | Admitting: Nurse Practitioner

## 2019-01-11 ENCOUNTER — Telehealth: Payer: Self-pay

## 2019-01-11 DIAGNOSIS — N958 Other specified menopausal and perimenopausal disorders: Secondary | ICD-10-CM

## 2019-01-11 MED ORDER — EST ESTROGENS-METHYLTEST 1.25-2.5 MG PO TABS: 1.0000 | ORAL_TABLET | Freq: Every day | ORAL | 0 refills | Status: DC

## 2019-01-11 NOTE — Progress Notes (Signed)
Refilled hormone replacement for 30 day prescription only.

## 2019-01-11 NOTE — Telephone Encounter (Signed)
Refilled hormone replacement for 30 day prescription only.

## 2019-01-14 ENCOUNTER — Other Ambulatory Visit: Payer: Self-pay

## 2019-01-14 ENCOUNTER — Encounter: Payer: Self-pay | Admitting: Nurse Practitioner

## 2019-01-14 ENCOUNTER — Ambulatory Visit: Payer: BC Managed Care – PPO | Admitting: Nurse Practitioner

## 2019-01-14 VITALS — BP 115/86 | HR 86 | Resp 16 | Ht 65.0 in | Wt 146.6 lb

## 2019-01-14 DIAGNOSIS — N390 Urinary tract infection, site not specified: Secondary | ICD-10-CM

## 2019-01-14 DIAGNOSIS — R319 Hematuria, unspecified: Secondary | ICD-10-CM | POA: Diagnosis not present

## 2019-01-14 DIAGNOSIS — N958 Other specified menopausal and perimenopausal disorders: Secondary | ICD-10-CM

## 2019-01-14 DIAGNOSIS — R300 Dysuria: Secondary | ICD-10-CM | POA: Diagnosis not present

## 2019-01-14 LAB — POCT URINALYSIS DIPSTICK
Bilirubin, UA: NEGATIVE
GLUCOSE UA: NEGATIVE
Ketones, UA: NEGATIVE
Nitrite, UA: NEGATIVE
Protein, UA: NEGATIVE
Spec Grav, UA: 1.01 (ref 1.010–1.025)
Urobilinogen, UA: 0.2 E.U./dL
pH, UA: 7.5 (ref 5.0–8.0)

## 2019-01-14 MED ORDER — EST ESTROGENS-METHYLTEST 1.25-2.5 MG PO TABS: 1.0000 | ORAL_TABLET | Freq: Every day | ORAL | 5 refills | Status: DC

## 2019-01-14 MED ORDER — CIPROFLOXACIN HCL 500 MG PO TABS: 500.0000 mg | ORAL_TABLET | Freq: Two times a day (BID) | ORAL | 0 refills | Status: DC

## 2019-01-14 NOTE — Progress Notes (Signed)
[facility] [address] [place], [place] [address]  Internal MEDICINE  Office Visit Note  Patient Name: Sara Peck  [id number]  [id number]  Date of Service: 01/14/2019  Chief Complaint  Patient presents with  . Medical Management of Chronic Issues    follow up medication refill estrogen     She is seen by pain management for chronic pain related to [place] issues. She gets prescription for pain medication as well as medication for attention deficit disorder. She has no new concerns or complaints today.     Urinary Tract Infection   This is a recurrent problem. The current episode started in the past 7 days. The problem occurs intermittently. The problem has been unchanged. The quality of the pain is described as burning. The pain is mild. There has been no fever. She is sexually active. There is no history of pyelonephritis. Associated symptoms include flank pain, frequency and hematuria. Pertinent negatives include no chills, nausea or vomiting. She has tried acetaminophen for the symptoms. The treatment provided mild relief. Her past medical history is significant for a urological procedure.       Current Medication: Outpatient Encounter Medications as of 01/14/2019  Medication Sig  . clorazepate (TRANXENE) 7.5 MG tablet TAKE 1 TABLET BY MOUTH TWICE A DAY AS NEEDED FOR ANXIETY  . estrogens-methylTEST (ESTRATEST) 1.25-2.5 MG tablet Take 1 tablet by mouth daily.  Marland Kitchen lisinopril (PRINIVIL,ZESTRIL) 5 MG tablet Take 5 mg by mouth daily.  . Oxycodone HCl 10 MG TABS Take 10 mg by mouth every 4 (four) hours as needed (every 4-6 hours PRN.  Pt usually uses 3 times/day).  . [DISCONTINUED] estrogens-methylTEST (ESTRATEST) 1.25-2.5 MG tablet Take 1 tablet by mouth daily.  . ciprofloxacin (CIPRO) 500 MG tablet Take 1 tablet (500 mg total) by mouth 2 (two) times daily.  . [DISCONTINUED] amphetamine-dextroamphetamine (ADDERALL) 10 MG tablet TAKE 1-2 TABS IN THE MORNING AND 1 TABLET  AT NOON  . [DISCONTINUED] ciprofloxacin (CIPRO) 500 MG tablet Take 1 tablet (500 mg total) by mouth 2 (two) times daily. (Patient not taking: Reported on 01/14/2019)  . [DISCONTINUED] docusate sodium (COLACE) 100 MG capsule Take 100 mg by mouth 2 (two) times daily as needed for mild constipation.   No facility-administered encounter medications on file as of 01/14/2019.     Surgical History: Past Surgical History:  Procedure Laterality Date  . ABDOMINAL HYSTERECTOMY  12-27-13  . APPENDECTOMY  12-27-13  . COLONOSCOPY  2014   [facility]  . LAPAROSCOPY     multiple  . MOLE REMOVAL    . MYRINGOTOMY WITH TUBE PLACEMENT Bilateral 03/18/2016   Procedure: MYRINGOTOMY WITH TUBE PLACEMENT;  Surgeon: Linus Salmons, MD;  Location: [facility];  Service: ENT;  Laterality: Bilateral;  BUTTERFLY TUBES  . NASAL ENDOSCOPY  2014   [facility]  . PARTIAL HYSTERECTOMY  Jan 2012   with left ovary/tube  . SKIN LESION EXCISION  2015   back  . TYMPANOSTOMY TUBE PLACEMENT  Jan 2012    Medical History: Past Medical History:  Diagnosis Date  . ADD (attention deficit disorder)   . Chronic kidney disease   . Endometriosis   . Headache    rare migraines.  stress HA more often  . Right flank pain    Se[facility] Linton Ham [facility]  . Thin basement membrane disease    sees Lisbeth Ply, MD at [facility]    Family History: Family History  Problem Relation Age of Onset  .  Cancer Mother        thyroid    Social History   Socioeconomic History  . Marital status: Married    Spouse name: Not on file  . Number of children: Not on file  . Years of education: Not on file  . Highest education level: Not on file  Occupational History  . Not on file  Social Needs  . Financial resource strain: Not on file  . Food insecurity:    Worry: Not on file    Inability: Not on file  . Transportation needs:    Medical: Not on file    Non-medical: Not on file  Tobacco Use  . Smoking status: Former Smoker     Packs/day: 0.50    Years: 5.00    Pack years: 2.50  . Smokeless tobacco: Never Used  Substance and Sexual Activity  . Alcohol use: Yes    Comment: occasionally beer (holidays, special occasions)  . Drug use: No  . Sexual activity: Not on file  Lifestyle  . Physical activity:    Days per week: Not on file    Minutes per session: Not on file  . Stress: Not on file  Relationships  . Social connections:    Talks on phone: Not on file    Gets together: Not on file    Attends religious service: Not on file    Active member of club or organization: Not on file    Attends meetings of clubs or organizations: Not on file    Relationship status: Not on file  . Intimate partner violence:    Fear of current or ex partner: Not on file    Emotionally abused: Not on file    Physically abused: Not on file    Forced sexual activity: Not on file  Other Topics Concern  . Not on file  Social History Narrative  . Not on file      Review of Systems  Constitutional: Negative for activity change, chills, fatigue and unexpected weight change.  HENT: Negative for congestion, postnasal drip, rhinorrhea, sneezing and sore throat.   Respiratory: Negative for cough, chest tightness and shortness of breath.   Cardiovascular: Negative for chest pain and palpitations.  Gastrointestinal: Negative for abdominal pain, constipation, diarrhea, nausea and vomiting.  Endocrine: Negative for cold intolerance, heat intolerance, polydipsia and polyuria.  Genitourinary: Positive for flank pain, frequency and hematuria. Negative for dysuria.  Musculoskeletal: Positive for back pain. Negative for arthralgias, joint swelling and neck pain.  Skin: Negative for color change, pallor, rash and wound.  Allergic/Immunologic: Negative for environmental allergies.  Neurological: Negative for dizziness, tremors, numbness and headaches.  Hematological: Negative for adenopathy. Does not bruise/bleed easily.   Psychiatric/Behavioral: Negative for behavioral problems (Depression), sleep disturbance and suicidal ideas. The patient is nervous/anxious.     Today's Vitals   01/14/19 1508  BP: 115/86  Pulse: 86  Resp: 16  SpO2: 98%  Weight: 146 lb 9.6 oz (66.5 kg)  Height: 5\' 5"  (1.651 m)   Body mass index is 24.4 kg/m.  Physical Exam Vitals signs and nursing note reviewed.  Constitutional:      General: She is not in acute distress.    Appearance: Normal appearance. She is well-developed. She is not diaphoretic.  HENT:     Head: Normocephalic and atraumatic.     Nose: Nose normal.     Mouth/Throat:     Pharynx: No oropharyngeal exudate.  Eyes:     Conjunctiva/sclera: Conjunctivae normal.  Pupils: Pupils are equal, round, and reactive to light.  Neck:     Musculoskeletal: Normal range of motion and neck supple.     Thyroid: No thyromegaly.     Vascular: No JVD.     Trachea: No tracheal deviation.  Cardiovascular:     Rate and Rhythm: Normal rate and regular rhythm.     Heart sounds: Normal heart sounds. No murmur. No friction rub. No gallop.   Pulmonary:     Effort: Pulmonary effort is normal. No respiratory distress.     Breath sounds: Normal breath sounds. No rales.  Chest:     Chest wall: No tenderness.  Abdominal:     General: Bowel sounds are normal.     Palpations: Abdomen is soft.     Tenderness: There is no abdominal tenderness.  Musculoskeletal: Normal range of motion.  Lymphadenopathy:     Cervical: No cervical adenopathy.  Skin:    General: Skin is warm and dry.  Neurological:     Mental Status: She is alert and oriented to person, place, and time.     Cranial Nerves: No cranial nerve deficit.  Psychiatric:        Behavior: Behavior normal.        Thought Content: Thought content normal.        Judgment: Judgment normal.   Assessment/Plan: 1. Urinary tract infection with hematuria, site unspecified Start cipro 500mg  bid for 7 days. Send urine for  culture and sensitivity and adjust antibiotics as indicated.  - ciprofloxacin (CIPRO) 500 MG tablet; Take 1 tablet (500 mg total) by mouth 2 (two) times daily.  Dispense: 14 tablet; Refill: 0  2. Artificial menopause Ad refills to current prescription for hormone replacement.  - estrogens-methylTEST (ESTRATEST) 1.25-2.5 MG tablet; Take 1 tablet by mouth daily.  Dispense: 30 tablet; Refill: 5  3. Dysuria - POCT Urinalysis Dipstick - CULTURE, URINE COMPREHENSIVE  General Counseling: Takyla verbalizes understanding of the findings of todays visit and agrees with plan of treatment. I have discussed any further diagnostic evaluation that may be needed or ordered today. We also reviewed her medications today. she has been encouraged to call the office with any questions or concerns that should arise related to todays visit.  This patient was seen by Vincent Gros FNP Collaboration with Dr Lyndon Code as a part of collaborative care agreement  Orders Placed This Encounter  Procedures  . CULTURE, URINE COMPREHENSIVE  . POCT Urinalysis Dipstick    Meds ordered this encounter  Medications  . ciprofloxacin (CIPRO) 500 MG tablet    Sig: Take 1 tablet (500 mg total) by mouth 2 (two) times daily.    Dispense:  14 tablet    Refill:  0    Order Specific Question:   Supervising Provider    Answer:   Lyndon Code [[id number]]  . estrogens-methylTEST (ESTRATEST) 1.25-2.5 MG tablet    Sig: Take 1 tablet by mouth daily.    Dispense:  30 tablet    Refill:  5    Order Specific Question:   Supervising Provider    Answer:   Lyndon Code [[id number]]    Time spent: 18 Minutes      Dr Lyndon Code Internal medicine

## 2019-01-16 DIAGNOSIS — G43009 Migraine without aura, not intractable, without status migrainosus: Secondary | ICD-10-CM | POA: Diagnosis not present

## 2019-01-16 DIAGNOSIS — G894 Chronic pain syndrome: Secondary | ICD-10-CM | POA: Diagnosis not present

## 2019-01-16 DIAGNOSIS — M545 Low back pain: Secondary | ICD-10-CM | POA: Diagnosis not present

## 2019-01-16 DIAGNOSIS — R319 Hematuria, unspecified: Secondary | ICD-10-CM | POA: Diagnosis not present

## 2019-01-16 DIAGNOSIS — F908 Attention-deficit hyperactivity disorder, other type: Secondary | ICD-10-CM | POA: Diagnosis not present

## 2019-01-16 LAB — CULTURE, URINE COMPREHENSIVE

## 2019-01-22 DIAGNOSIS — L570 Actinic keratosis: Secondary | ICD-10-CM | POA: Diagnosis not present

## 2019-01-22 DIAGNOSIS — L578 Other skin changes due to chronic exposure to nonionizing radiation: Secondary | ICD-10-CM | POA: Diagnosis not present

## 2019-01-22 DIAGNOSIS — Z1283 Encounter for screening for malignant neoplasm of skin: Secondary | ICD-10-CM | POA: Diagnosis not present

## 2019-01-22 DIAGNOSIS — Z85828 Personal history of other malignant neoplasm of skin: Secondary | ICD-10-CM | POA: Diagnosis not present

## 2019-02-25 ENCOUNTER — Encounter: Payer: Self-pay | Admitting: Nurse Practitioner

## 2019-03-19 DIAGNOSIS — G894 Chronic pain syndrome: Secondary | ICD-10-CM | POA: Diagnosis not present

## 2019-03-19 DIAGNOSIS — M545 Low back pain: Secondary | ICD-10-CM | POA: Diagnosis not present

## 2019-04-09 ENCOUNTER — Encounter: Payer: Self-pay | Admitting: Nurse Practitioner

## 2019-04-09 ENCOUNTER — Ambulatory Visit: Payer: BC Managed Care – PPO | Admitting: Nurse Practitioner

## 2019-04-09 ENCOUNTER — Other Ambulatory Visit: Payer: Self-pay

## 2019-04-09 VITALS — Ht 65.0 in | Wt 140.0 lb

## 2019-04-09 DIAGNOSIS — N390 Urinary tract infection, site not specified: Secondary | ICD-10-CM | POA: Diagnosis not present

## 2019-04-09 DIAGNOSIS — N958 Other specified menopausal and perimenopausal disorders: Secondary | ICD-10-CM | POA: Diagnosis not present

## 2019-04-09 DIAGNOSIS — R319 Hematuria, unspecified: Secondary | ICD-10-CM

## 2019-04-09 MED ORDER — EST ESTROGENS-METHYLTEST 1.25-2.5 MG PO TABS: 1.0000 | ORAL_TABLET | Freq: Every day | ORAL | 5 refills | Status: DC

## 2019-04-09 NOTE — Progress Notes (Signed)
[facility] [address] [place], [place] [id number]  Internal MEDICINE  Telephone Visit  Patient Name: Sara Peck  [id number]  [id number]  Date of Service: 04/17/2019  I connected with the patient at 3:33pm by webcam and verified the patients identity using two identifiers.   I discussed the limitations, risks, security and privacy concerns of performing an evaluation and management service by webcam and the availability of in person appointments. I also discussed with the patient that there may be a patient responsible charge related to the service.  The patient expressed Peck and agrees to proceed.    Chief Complaint  Patient presents with  . Telephone Screen    VIDEO VISIT [phone]  . Telephone Assessment  . Medical Management of Chronic Issues    6 month follow up, medication refill    The patient has been contacted via webcam for follow up visit due to concerns for spread of novel coronavirus. The patient is currently on cipro for presumed UTI from her urologist. She started antibiotics last Thursday. She states that she is only a little better.       Current Medication: Outpatient Encounter Medications as of 04/09/2019  Medication Sig  . ciprofloxacin (CIPRO) 500 MG tablet Take 1 tablet (500 mg total) by mouth 2 (two) times daily.  . clorazepate (TRANXENE) 7.5 MG tablet TAKE 1 TABLET BY MOUTH TWICE A DAY AS NEEDED FOR ANXIETY  . estrogens-methylTEST (ESTRATEST) 1.25-2.5 MG tablet Take 1 tablet by mouth daily.  Marland Kitchen. lisinopril (PRINIVIL,ZESTRIL) 5 MG tablet Take 5 mg by mouth daily.  . Oxycodone HCl 10 MG TABS Take 10 mg by mouth every 4 (four) hours as needed (every 4-6 hours PRN.  Pt usually uses 3 times/day).  . [DISCONTINUED] estrogens-methylTEST (ESTRATEST) 1.25-2.5 MG tablet Take 1 tablet by mouth daily.   No facility-administered encounter medications on file as of 04/09/2019.     Surgical History: Past Surgical History:  Procedure  Laterality Date  . ABDOMINAL HYSTERECTOMY  12-27-13  . APPENDECTOMY  12-27-13  . COLONOSCOPY  2014   [facility]  . LAPAROSCOPY     multiple  . MOLE REMOVAL    . MYRINGOTOMY WITH TUBE PLACEMENT Bilateral 03/18/2016   Procedure: MYRINGOTOMY WITH TUBE PLACEMENT;  Surgeon: Linus Salmonshapman McQueen, MD;  [facility];  Service: ENT;  Laterality: Bilateral;  BUTTERFLY TUBES  . NASAL ENDOSCOPY  2014   [facility]  . PARTIAL HYSTERECTOMY  Jan 2012   with left ovary/tube  . SKIN LESION EXCISION  2015   back  . TYMPANOSTOMY TUBE PLACEMENT  Jan 2012    Medical History: Past Medical History:  Diagnosis Date  . ADD (attention deficit disorder)   . Chronic kidney disease   . Endometriosis   . Headache    rare migraines.  stress HA more often  . Right flank pain    [facility]  . Thin basement membrane disease    se[facility] Lis[facility] PlyKarin True[facility]    Family History: Family History  Problem Relation Age of Onset  . Cancer Mother        thyroid    Social History   Socioeconomic History  . Marital status: Married    Spouse name: Not on file  . Number of children: Not on file  . Years of education: Not on file  . Highest education level: Not on file  Occupational History  . Not on file  Social Needs  .  Financial resource strain: Not on file  . Food insecurity:    Worry: Not on file    Inability: Not on file  . Transportation needs:    Medical: Not on file    Non-medical: Not on file  Tobacco Use  . Smoking status: Former Smoker    Packs/day: 0.50    Years: 5.00    Pack years: 2.50  . Smokeless tobacco: Never Used  Substance and Sexual Activity  . Alcohol use: Yes    Comment: occasionally beer (holidays, special occasions)  . Drug use: No  . Sexual activity: Not on file  Lifestyle  . Physical activity:    Days per week: Not on file    Minutes per session: Not on file  . Stress: Not on file  Relationships  . Social connections:    Talks on phone: Not  on file    Gets together: Not on file    Attends religious service: Not on file    Active member of club or organization: Not on file    Attends meetings of clubs or organizations: Not on file    Relationship status: Not on file  . Intimate partner violence:    Fear of current or ex partner: Not on file    Emotionally abused: Not on file    Physically abused: Not on file    Forced sexual activity: Not on file  Other Topics Concern  . Not on file  Social History Narrative  . Not on file      Review of Systems  Constitutional: Negative for activity change, chills, fatigue and unexpected weight change.  HENT: Negative for congestion, postnasal drip, rhinorrhea, sneezing and sore throat.   Respiratory: Negative for cough, chest tightness and shortness of breath.   Cardiovascular: Negative for chest pain and palpitations.  Gastrointestinal: Negative for abdominal pain, constipation, diarrhea, nausea and vomiting.  Endocrine: Negative for cold intolerance, heat intolerance, polydipsia and polyuria.  Genitourinary: Positive for flank pain, frequency, hematuria and pelvic pain. Negative for dysuria.  Musculoskeletal: Positive for back pain. Negative for arthralgias, joint swelling and neck pain.  Skin: Negative for color change, pallor, rash and wound.  Allergic/Immunologic: Negative for environmental allergies.  Neurological: Negative for dizziness, tremors, numbness and headaches.  Hematological: Negative for adenopathy. Does not bruise/bleed easily.  Psychiatric/Behavioral: Negative for behavioral problems (Depression), sleep disturbance and suicidal ideas. The patient is nervous/anxious.     Today's Vitals   04/09/19 1518  Weight: 140 lb (63.5 kg)  Height: 5\' 5"  (1.651 m)   Body mass index is 23.3 kg/m.  Observation/Objective:   The patient is alert and oriented. She is pleasant and answers all questions appropriately. Breathing is non-labored. She is in no acute distress at  this time.    Assessment/Plan: 1. Urinary tract infection with hematuria, site unspecified Will send to lab to obtain u/a with culture and sensitivity. Treat as indicated.   2. Artificial menopause Renew estratest and continue as prescribed. CPE with pap smear at next visit . - estrogens-methylTEST (ESTRATEST) 1.25-2.5 MG tablet; Take 1 tablet by mouth daily.  Dispense: 30 tablet; Refill: 5  General Counseling: Sara Peck of the findings of today's phone visit and agrees with plan of treatment. I have discussed any further diagnostic evaluation that may be needed or ordered today. We also reviewed her medications today. she has been encouraged to call the office with any questions or concerns that should arise related to todays visit.   This patient was  seen by Vincent Gros FNP Collaboration with Dr Lyndon Code as a part of collaborative care agreement  Meds ordered this encounter  Medications  . estrogens-methylTEST (ESTRATEST) 1.25-2.5 MG tablet    Sig: Take 1 tablet by mouth daily.    Dispense:  30 tablet    Refill:  5    Order Specific Question:   Supervising Provider    Answer:   Lyndon Code [[id number]]    Time spent: 71 Minutes    Dr Lyndon Code Internal medicine

## 2019-04-24 DIAGNOSIS — R5382 Chronic fatigue, unspecified: Secondary | ICD-10-CM | POA: Diagnosis not present

## 2019-04-24 DIAGNOSIS — M545 Low back pain: Secondary | ICD-10-CM | POA: Diagnosis not present

## 2019-04-24 DIAGNOSIS — G894 Chronic pain syndrome: Secondary | ICD-10-CM | POA: Diagnosis not present

## 2019-04-24 DIAGNOSIS — R319 Hematuria, unspecified: Secondary | ICD-10-CM | POA: Diagnosis not present

## 2019-04-24 DIAGNOSIS — Z79891 Long term (current) use of opiate analgesic: Secondary | ICD-10-CM | POA: Diagnosis not present

## 2019-05-13 DIAGNOSIS — G43009 Migraine without aura, not intractable, without status migrainosus: Secondary | ICD-10-CM | POA: Diagnosis not present

## 2019-05-13 DIAGNOSIS — M545 Low back pain: Secondary | ICD-10-CM | POA: Diagnosis not present

## 2019-05-13 DIAGNOSIS — F908 Attention-deficit hyperactivity disorder, other type: Secondary | ICD-10-CM | POA: Diagnosis not present

## 2019-05-13 DIAGNOSIS — G894 Chronic pain syndrome: Secondary | ICD-10-CM | POA: Diagnosis not present

## 2019-05-13 DIAGNOSIS — R319 Hematuria, unspecified: Secondary | ICD-10-CM | POA: Diagnosis not present

## 2019-06-18 ENCOUNTER — Encounter: Payer: Self-pay | Admitting: Nurse Practitioner

## 2019-06-18 NOTE — Telephone Encounter (Signed)
I''m sorry. But thank you so much. Every tme I try to respond to her I get an error message.

## 2019-06-18 NOTE — Telephone Encounter (Signed)
What do you think about this?  I last saw her 04/09/2019

## 2019-06-18 NOTE — Telephone Encounter (Signed)
Can you let her know this again. For some reason, I can't respond to her.

## 2019-06-19 ENCOUNTER — Other Ambulatory Visit: Payer: Self-pay | Admitting: Nurse Practitioner

## 2019-06-19 DIAGNOSIS — R319 Hematuria, unspecified: Secondary | ICD-10-CM | POA: Diagnosis not present

## 2019-06-21 LAB — URINALYSIS, ROUTINE W REFLEX MICROSCOPIC
Bilirubin, UA: NEGATIVE
Glucose, UA: NEGATIVE
Ketones, UA: NEGATIVE
Leukocytes,UA: NEGATIVE
Nitrite, UA: NEGATIVE
Protein,UA: NEGATIVE
Specific Gravity, UA: 1.015 (ref 1.005–1.030)
Urobilinogen, Ur: 0.2 mg/dL (ref 0.2–1.0)
pH, UA: 6.5 (ref 5.0–7.5)

## 2019-06-21 LAB — URINE CULTURE

## 2019-06-21 LAB — CBC
Hematocrit: 42.4 % (ref 34.0–46.6)
Hemoglobin: 14.1 g/dL (ref 11.1–15.9)
MCH: 30.8 pg (ref 26.6–33.0)
MCHC: 33.3 g/dL (ref 31.5–35.7)
MCV: 93 fL (ref 79–97)
Platelets: 309 10*3/uL (ref 150–450)
RBC: 4.58 x10E6/uL (ref 3.77–5.28)
RDW: 12.9 % (ref 11.7–15.4)
WBC: 6.3 10*3/uL (ref 3.4–10.8)

## 2019-06-21 LAB — COMPREHENSIVE METABOLIC PANEL
ALT: 12 IU/L (ref 0–32)
AST: 17 IU/L (ref 0–40)
Albumin/Globulin Ratio: 2.2 (ref 1.2–2.2)
Albumin: 4.9 g/dL — ABNORMAL HIGH (ref 3.8–4.8)
Alkaline Phosphatase: 77 IU/L (ref 39–117)
BUN/Creatinine Ratio: 10 (ref 9–23)
BUN: 9 mg/dL (ref 6–20)
Bilirubin Total: 0.8 mg/dL (ref 0.0–1.2)
CO2: 26 mmol/L (ref 20–29)
Calcium: 9.5 mg/dL (ref 8.7–10.2)
Chloride: 102 mmol/L (ref 96–106)
Creatinine, Ser: 0.92 mg/dL (ref 0.57–1.00)
GFR calc Af Amer: 93 mL/min/{1.73_m2} (ref >59)
GFR calc non Af Amer: 80 mL/min/{1.73_m2} (ref >59)
Globulin, Total: 2.2 g/dL (ref 1.5–4.5)
Glucose: 83 mg/dL (ref 65–99)
Potassium: 4.6 mmol/L (ref 3.5–5.2)
Sodium: 143 mmol/L (ref 134–144)
Total Protein: 7.1 g/dL (ref 6.0–8.5)

## 2019-06-21 LAB — MICROSCOPIC EXAMINATION: Casts: NONE SEEN /lpf

## 2019-06-25 NOTE — Progress Notes (Signed)
Discuss labs and give copy of labs to patient at visit 06/27/2019

## 2019-06-27 ENCOUNTER — Ambulatory Visit: Payer: BC Managed Care – PPO | Admitting: Nurse Practitioner

## 2019-06-28 DIAGNOSIS — M545 Low back pain: Secondary | ICD-10-CM | POA: Diagnosis not present

## 2019-06-28 DIAGNOSIS — G8929 Other chronic pain: Secondary | ICD-10-CM | POA: Diagnosis not present

## 2019-06-28 DIAGNOSIS — N809 Endometriosis, unspecified: Secondary | ICD-10-CM | POA: Diagnosis not present

## 2019-06-28 DIAGNOSIS — R109 Unspecified abdominal pain: Secondary | ICD-10-CM | POA: Diagnosis not present

## 2019-06-28 DIAGNOSIS — R319 Hematuria, unspecified: Secondary | ICD-10-CM | POA: Diagnosis not present

## 2019-06-28 DIAGNOSIS — N029 Recurrent and persistent hematuria with unspecified morphologic changes: Secondary | ICD-10-CM | POA: Diagnosis not present

## 2019-07-03 DIAGNOSIS — R109 Unspecified abdominal pain: Secondary | ICD-10-CM | POA: Diagnosis not present

## 2019-07-03 DIAGNOSIS — N398 Other specified disorders of urinary system: Secondary | ICD-10-CM | POA: Diagnosis not present

## 2019-07-04 DIAGNOSIS — M545 Low back pain: Secondary | ICD-10-CM | POA: Diagnosis not present

## 2019-07-04 DIAGNOSIS — R319 Hematuria, unspecified: Secondary | ICD-10-CM | POA: Diagnosis not present

## 2019-07-05 DIAGNOSIS — Z79899 Other long term (current) drug therapy: Secondary | ICD-10-CM | POA: Diagnosis not present

## 2019-07-05 DIAGNOSIS — R319 Hematuria, unspecified: Secondary | ICD-10-CM | POA: Diagnosis not present

## 2019-07-05 DIAGNOSIS — M545 Low back pain: Secondary | ICD-10-CM | POA: Diagnosis not present

## 2019-07-05 DIAGNOSIS — R1030 Lower abdominal pain, unspecified: Secondary | ICD-10-CM | POA: Diagnosis not present

## 2019-07-18 DIAGNOSIS — R319 Hematuria, unspecified: Secondary | ICD-10-CM | POA: Diagnosis not present

## 2019-07-18 DIAGNOSIS — N9489 Other specified conditions associated with female genital organs and menstrual cycle: Secondary | ICD-10-CM | POA: Diagnosis not present

## 2019-07-18 DIAGNOSIS — M545 Low back pain: Secondary | ICD-10-CM | POA: Diagnosis not present

## 2019-07-22 DIAGNOSIS — L2389 Allergic contact dermatitis due to other agents: Secondary | ICD-10-CM | POA: Diagnosis not present

## 2019-07-22 DIAGNOSIS — L821 Other seborrheic keratosis: Secondary | ICD-10-CM | POA: Diagnosis not present

## 2019-07-24 DIAGNOSIS — M545 Low back pain: Secondary | ICD-10-CM | POA: Diagnosis not present

## 2019-07-24 DIAGNOSIS — G894 Chronic pain syndrome: Secondary | ICD-10-CM | POA: Diagnosis not present

## 2019-07-24 DIAGNOSIS — R319 Hematuria, unspecified: Secondary | ICD-10-CM | POA: Diagnosis not present

## 2019-07-26 DIAGNOSIS — J342 Deviated nasal septum: Secondary | ICD-10-CM | POA: Diagnosis not present

## 2019-07-26 DIAGNOSIS — J300 Vasomotor rhinitis: Secondary | ICD-10-CM | POA: Diagnosis not present

## 2019-07-26 DIAGNOSIS — H6123 Impacted cerumen, bilateral: Secondary | ICD-10-CM | POA: Diagnosis not present

## 2019-07-31 DIAGNOSIS — R319 Hematuria, unspecified: Secondary | ICD-10-CM | POA: Diagnosis not present

## 2019-07-31 DIAGNOSIS — M545 Low back pain: Secondary | ICD-10-CM | POA: Diagnosis not present

## 2019-09-20 DIAGNOSIS — R319 Hematuria, unspecified: Secondary | ICD-10-CM | POA: Diagnosis not present

## 2019-09-20 DIAGNOSIS — M545 Low back pain: Secondary | ICD-10-CM | POA: Diagnosis not present

## 2019-10-09 ENCOUNTER — Telehealth: Payer: Self-pay

## 2019-10-09 ENCOUNTER — Other Ambulatory Visit: Payer: Self-pay | Admitting: Nurse Practitioner

## 2019-10-09 DIAGNOSIS — N958 Other specified menopausal and perimenopausal disorders: Secondary | ICD-10-CM

## 2019-10-09 MED ORDER — EST ESTROGENS-METHYLTEST 1.25-2.5 MG PO TABS: 1.0000 | ORAL_TABLET | Freq: Every day | ORAL | 5 refills | Status: DC

## 2019-10-09 NOTE — Progress Notes (Signed)
Refilled estrogen/testosterone combination. Sent to [facility]

## 2019-10-09 NOTE — Telephone Encounter (Signed)
Refilled estrogen/testosterone combination. Sent to [facility]

## 2019-10-21 ENCOUNTER — Other Ambulatory Visit: Payer: Self-pay

## 2019-10-21 DIAGNOSIS — Z20822 Contact with and (suspected) exposure to covid-19: Secondary | ICD-10-CM

## 2019-10-23 LAB — NOVEL CORONAVIRUS, NAA: SARS-CoV-2, NAA: NOT DETECTED

## 2019-10-24 ENCOUNTER — Other Ambulatory Visit: Payer: Self-pay

## 2019-10-25 ENCOUNTER — Ambulatory Visit: Payer: BC Managed Care – PPO | Attending: Internal Medicine

## 2019-10-25 DIAGNOSIS — Z20828 Contact with and (suspected) exposure to other viral communicable diseases: Secondary | ICD-10-CM | POA: Diagnosis not present

## 2019-10-25 DIAGNOSIS — Z20822 Contact with and (suspected) exposure to covid-19: Secondary | ICD-10-CM

## 2019-10-26 LAB — NOVEL CORONAVIRUS, NAA: SARS-CoV-2, NAA: NOT DETECTED

## 2019-11-04 ENCOUNTER — Ambulatory Visit: Payer: BC Managed Care – PPO | Admitting: Adult Health

## 2019-11-04 DIAGNOSIS — J0120 Acute ethmoidal sinusitis, unspecified: Secondary | ICD-10-CM | POA: Diagnosis not present

## 2019-11-06 ENCOUNTER — Ambulatory Visit: Payer: BC Managed Care – PPO | Admitting: Adult Health

## 2019-11-08 ENCOUNTER — Ambulatory Visit
Admission: EM | Admit: 2019-11-08 | Discharge: 2019-11-08 | Disposition: A | Discharge status: Home / Self Care | Payer: BC Managed Care – PPO | Attending: Family Medicine | Admitting: Family Medicine

## 2019-11-08 ENCOUNTER — Other Ambulatory Visit: Payer: Self-pay

## 2019-11-08 ENCOUNTER — Encounter: Payer: Self-pay | Admitting: Emergency Medicine

## 2019-11-08 ENCOUNTER — Ambulatory Visit (INDEPENDENT_AMBULATORY_CARE_PROVIDER_SITE_OTHER): Payer: BC Managed Care – PPO

## 2019-11-08 DIAGNOSIS — Z79899 Other long term (current) drug therapy: Secondary | ICD-10-CM | POA: Insufficient documentation

## 2019-11-08 DIAGNOSIS — Z882 Allergy status to sulfonamides status: Secondary | ICD-10-CM | POA: Diagnosis not present

## 2019-11-08 DIAGNOSIS — R05 Cough: Secondary | ICD-10-CM | POA: Diagnosis not present

## 2019-11-08 DIAGNOSIS — Z87891 Personal history of nicotine dependence: Secondary | ICD-10-CM | POA: Diagnosis not present

## 2019-11-08 DIAGNOSIS — Z881 Allergy status to other antibiotic agents status: Secondary | ICD-10-CM | POA: Diagnosis not present

## 2019-11-08 DIAGNOSIS — Z808 Family history of malignant neoplasm of other organs or systems: Secondary | ICD-10-CM | POA: Insufficient documentation

## 2019-11-08 DIAGNOSIS — R079 Chest pain, unspecified: Secondary | ICD-10-CM | POA: Diagnosis not present

## 2019-11-08 DIAGNOSIS — U071 COVID-19: Secondary | ICD-10-CM | POA: Diagnosis not present

## 2019-11-08 DIAGNOSIS — J209 Acute bronchitis, unspecified: Secondary | ICD-10-CM | POA: Diagnosis not present

## 2019-11-08 MED ORDER — PREDNISONE 50 MG PO TABS: ORAL_TABLET | ORAL | 0 refills | Status: DC

## 2019-11-08 NOTE — Discharge Instructions (Signed)
Continue antibiotic.  Prednisone as prescribed.  Take care  Dr. Adriana Simas

## 2019-11-08 NOTE — ED Triage Notes (Signed)
Patient c/o cough and chest congestion since Monday.  Patient reports chest pain that started today.  Patient states that she was treated for sinus infection with Amoxicillin on Monday.  Patient was tested for COVID on Dec. 18 and was Negative.  Patient denies fevers.

## 2019-11-08 NOTE — ED Provider Notes (Signed)
[facility]    CSN: [id number] Arrival date & time: 11/08/19  1621  History   Chief Complaint Chief Complaint  Patient presents with  . Headache  . Cough  . Chest Pain   HPI  39 year old female presents with the above complaints.  Patient reports that she was placed on Augmentin after her recent visit on Monday.  She states that she was placed on this for sinus infection.  She states that her sinus symptoms have improved.  However, she is now plagued by cough and chest congestion.  She reports chest tightness and pain.  She has been tested for Covid twice and has been negative.  No fever.  No shortness of breath.  Rates her pain as 2/10 in severity.  Patient is concerned that she may have something developing in her chest.  No relieving factors.  No other complaints.   PMH, Surgical Hx, Family Hx, Social History reviewed and updated as below.  PMH: Patient Active Problem List   Diagnosis Date Noted  . Personal history of other malignant neoplasm of skin 10/23/2018  . Urinary tract infection with hematuria 07/22/2018  . Dysuria 07/22/2018  . Pain syndrome, chronic 02/16/2018  . GAD (generalized anxiety disorder) 12/15/2017  . Thin basement membrane nephropathy 04/12/2017  . Migraine without aura and without status migrainosus, not intractable 09/15/2016  . Endometriosis 01/19/2015  . Adult attention deficit disorder 12/24/2014  . Hematoma, postoperative 09/20/2014  . Microscopic hematuria 09/20/2014  . Abdominal pain, unspecified site 03/28/2014  . Artificial menopause 01/08/2014  . Encounter for general adult medical examination without abnormal findings 12/03/2012    Past Surgical History:  Procedure Laterality Date  . ABDOMINAL HYSTERECTOMY  12-27-13  . APPENDECTOMY  12-27-13  . COLONOSCOPY  2014   [facility]  . LAPAROSCOPY     multiple  . MOLE REMOVAL    . MYRINGOTOMY WITH TUBE PLACEMENT Bilateral 03/18/2016   Procedure: MYRINGOTOMY WITH TUBE PLACEMENT;   Surgeon: Beverly Gust, MD;  Location: [place];  Service: ENT;  Laterality: Bilateral;  BUTTERFLY TUBES  . NASAL ENDOSCOPY  2014   [facility]  . PARTIAL HYSTERECTOMY  Jan 2012   with left ovary/tube  . SKIN LESION EXCISION  2015   back  . TYMPANOSTOMY TUBE PLACEMENT  Jan 2012    OB History    Gravida  2   Para  2   Term      Preterm      AB      Living        SAB      TAB      Ectopic      Multiple      Live Births           Obstetric Comments  1st Menstrual Cycle:  15 1st Pregnancy:  57 Menopause age 46         Home Medications    Prior to Admission medications   Medication Sig Start Date End Date Taking? Authorizing Provider  estrogens-methylTEST (ESTRATEST) 1.25-2.5 MG tablet Take 1 tablet by mouth daily. 10/09/19  Yes Boscia, Heather E, NP  lisinopril (PRINIVIL,ZESTRIL) 5 MG tablet Take 5 mg by mouth daily. 11/05/17  Yes [provider]  amoxicillin-clavulanate (AUGMENTIN) 875-125 MG tablet Take 1 tablet by mouth 2 (two) times daily. 11/04/19   [provider]  clorazepate (TRANXENE) 7.5 MG tablet TAKE 1 TABLET BY MOUTH TWICE A DAY AS NEEDED FOR ANXIETY 12/15/17   Ronnell Freshwater,  NP  Oxycodone HCl 10 MG TABS Take 10 mg by mouth every 4 (four) hours as needed (every 4-6 hours PRN.  Pt usually uses 3 times/day).    [provider]  predniSONE (DELTASONE) 50 MG tablet 1 tablet daily x 5 days 11/08/19   Tommie Sams, DO    Family History Family History  Problem Relation Age of Onset  . Cancer Mother        thyroid    Social History Social History   Tobacco Use  . Smoking status: Former Smoker    Packs/day: 0.50    Years: 5.00    Pack years: 2.50  . Smokeless tobacco: Never Used  Substance Use Topics  . Alcohol use: Yes    Comment: occasionally beer (holidays, special occasions)  . Drug use: No     Allergies   Cephalexin, Nitrofurantoin, Sulfa antibiotics, and Topiramate   Review of Systems Review  of Systems  Constitutional: Negative for fever.  Respiratory: Positive for cough and chest tightness.    Physical Exam Triage Vital Signs ED Triage Vitals  Enc Vitals Group     BP 11/08/19 1650 114/73     Pulse Rate 11/08/19 1650 70     Resp 11/08/19 1650 14     Temp 11/08/19 1650 98.2 F (36.8 C)     Temp Source 11/08/19 1650 Oral     SpO2 11/08/19 1650 100 %     Weight 11/08/19 1645 136 lb (61.7 kg)     Height 11/08/19 1645 5\' 5"  (1.651 m)     Head Circumference --      Peak Flow --      Pain Score 11/08/19 1645 2     Pain Loc --      Pain Edu? --      Excl. in GC? --    Updated Vital Signs BP 114/73 (BP Location: Left Arm)   Pulse 70   Temp 98.2 F (36.8 C) (Oral)   Resp 14   Ht 5\' 5"  (1.651 m)   Wt 61.7 kg   SpO2 100%   BMI 22.63 kg/m   Visual Acuity Right Eye Distance:   Left Eye Distance:   Bilateral Distance:    Right Eye Near:   Left Eye Near:    Bilateral Near:     Physical Exam Vitals and nursing note reviewed.  Constitutional:      General: She is not in acute distress.    Appearance: Normal appearance. She is not ill-appearing.  HENT:     Head: Normocephalic and atraumatic.  Eyes:     General:        Right eye: No discharge.        Left eye: No discharge.     Conjunctiva/sclera: Conjunctivae normal.  Cardiovascular:     Rate and Rhythm: Normal rate and regular rhythm.     Heart sounds: No murmur.  Pulmonary:     Effort: Pulmonary effort is normal.     Breath sounds: Normal breath sounds. No wheezing, rhonchi or rales.  Neurological:     Mental Status: She is alert.  Psychiatric:        Mood and Affect: Mood normal.        Behavior: Behavior normal.    UC Treatments / Results  Labs (all labs ordered are listed, but only abnormal results are displayed) Labs Reviewed  NOVEL CORONAVIRUS, NAA (HOSP ORDER, SEND-OUT TO REF LAB; TAT 18-24 HRS)    EKG  Radiology DG Chest 2 View  Result Date: 11/08/2019 CLINICAL DATA:  Chest pain  and cough beginning 4 days ago. EXAM: CHEST - 2 VIEW COMPARISON:  None. FINDINGS: Heart size is normal. Mediastinal shadows are normal. There may be mild central bronchial thickening but there is no infiltrate, collapse or effusion. No significant bone finding. IMPRESSION: No active disease versus mild bronchitis pattern. Electronically Signed   By: Paulina Fusi M.D.   On: 11/08/2019 17:34    Procedures Procedures (including critical care time)  Medications Ordered in UC Medications - No data to display  Initial Impression / Assessment and Plan / UC Course  I have reviewed the triage vital signs and the nursing notes.  Pertinent labs & imaging results that were available during my care of the patient were reviewed by me and considered in my medical decision making (see chart for details).    39 year old female presents with acute bronchitis. See chest x-ray results above.  Finish course of Augmentin as provided by previous provider.  Prednisone as directed.  Supportive care.  Final Clinical Impressions(s) / UC Diagnoses   Final diagnoses:  Acute bronchitis, unspecified organism     Discharge Instructions     Continue antibiotic.  Prednisone as prescribed.  Take care  Dr. Adriana Simas    ED Prescriptions    Medication Sig Dispense Auth. Provider   predniSONE (DELTASONE) 50 MG tablet 1 tablet daily x 5 days 5 tablet Everlene Other G, DO     PDMP not reviewed this encounter.   Tommie Sams, [place] 11/08/19 1952

## 2019-11-11 ENCOUNTER — Telehealth (HOSPITAL_COMMUNITY): Payer: Self-pay | Admitting: Emergency Medicine

## 2019-11-11 ENCOUNTER — Encounter (HOSPITAL_COMMUNITY): Payer: Self-pay

## 2019-11-11 NOTE — Telephone Encounter (Signed)
Your test for COVID-19 was positive, meaning that you were infected with the novel coronavirus and could give the germ to others.  Please continue isolation at home for at least 10 days since the start of your symptoms. If you do not have symptoms, please isolate at home for 10 days from the day you were tested. Once you complete your 10 day quarantine, you may return to normal activities as long as you've not had a fever for over 24 hours(without taking fever reducing medicine) and your symptoms are improving. Please continue good preventive care measures, including:  frequent hand-washing, avoid touching your face, cover coughs/sneezes, stay out of crowds and keep a 6 foot distance from others.  Go to the nearest hospital emergency room if fever/cough/breathlessness are severe or illness seems like a threat to life.  Attempted to reach patient. No answer at this time. Mailbox full.   If you have any questions, you may call me at [phone]

## 2019-11-13 LAB — NOVEL CORONAVIRUS, NAA (HOSP ORDER, SEND-OUT TO REF LAB; TAT 18-24 HRS): SARS-CoV-2, NAA: DETECTED — AB

## 2019-11-20 DIAGNOSIS — M545 Low back pain: Secondary | ICD-10-CM | POA: Diagnosis not present

## 2019-11-20 DIAGNOSIS — G894 Chronic pain syndrome: Secondary | ICD-10-CM | POA: Diagnosis not present

## 2019-11-20 DIAGNOSIS — R319 Hematuria, unspecified: Secondary | ICD-10-CM | POA: Diagnosis not present

## 2019-12-02 DIAGNOSIS — H6983 Other specified disorders of Eustachian tube, bilateral: Secondary | ICD-10-CM | POA: Diagnosis not present

## 2019-12-02 DIAGNOSIS — H9212 Otorrhea, left ear: Secondary | ICD-10-CM | POA: Diagnosis not present

## 2019-12-02 DIAGNOSIS — H6123 Impacted cerumen, bilateral: Secondary | ICD-10-CM | POA: Diagnosis not present

## 2019-12-16 DIAGNOSIS — H9212 Otorrhea, left ear: Secondary | ICD-10-CM | POA: Diagnosis not present

## 2019-12-16 DIAGNOSIS — H906 Mixed conductive and sensorineural hearing loss, bilateral: Secondary | ICD-10-CM | POA: Diagnosis not present

## 2019-12-16 DIAGNOSIS — H6980 Other specified disorders of Eustachian tube, unspecified ear: Secondary | ICD-10-CM | POA: Diagnosis not present

## 2020-01-06 DIAGNOSIS — G43009 Migraine without aura, not intractable, without status migrainosus: Secondary | ICD-10-CM | POA: Diagnosis not present

## 2020-01-06 DIAGNOSIS — F411 Generalized anxiety disorder: Secondary | ICD-10-CM | POA: Diagnosis not present

## 2020-01-06 DIAGNOSIS — G8929 Other chronic pain: Secondary | ICD-10-CM | POA: Diagnosis not present

## 2020-01-06 DIAGNOSIS — Z79899 Other long term (current) drug therapy: Secondary | ICD-10-CM | POA: Diagnosis not present

## 2020-01-06 DIAGNOSIS — R3129 Other microscopic hematuria: Secondary | ICD-10-CM | POA: Diagnosis not present

## 2020-01-06 DIAGNOSIS — M545 Low back pain: Secondary | ICD-10-CM | POA: Diagnosis not present

## 2020-01-06 DIAGNOSIS — R109 Unspecified abdominal pain: Secondary | ICD-10-CM | POA: Diagnosis not present

## 2020-01-10 IMAGING — CR DG CHEST 2V
2 series · 2 of 2 positions shown · non-contrast
Comparison: None.

CLINICAL DATA: Chest pain and cough beginning 4 days ago.

EXAM:
CHEST - 2 VIEW

[chest pa]
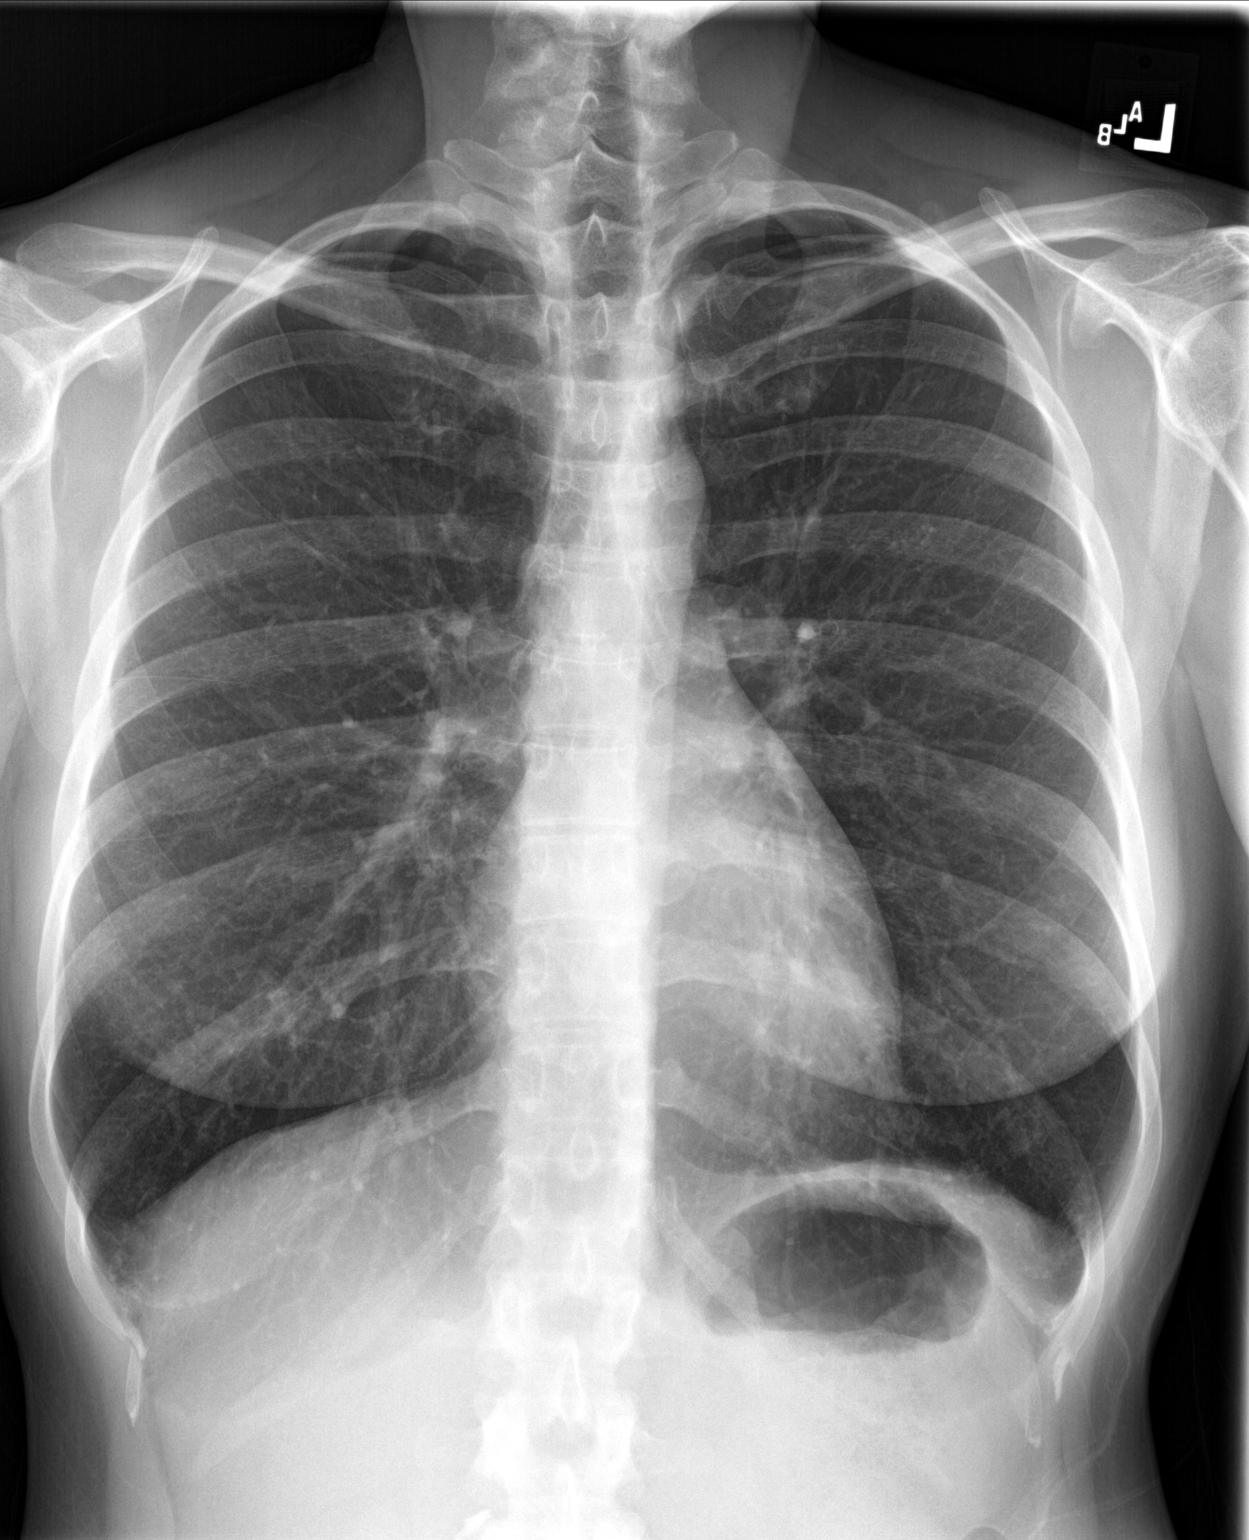

[chest lat]
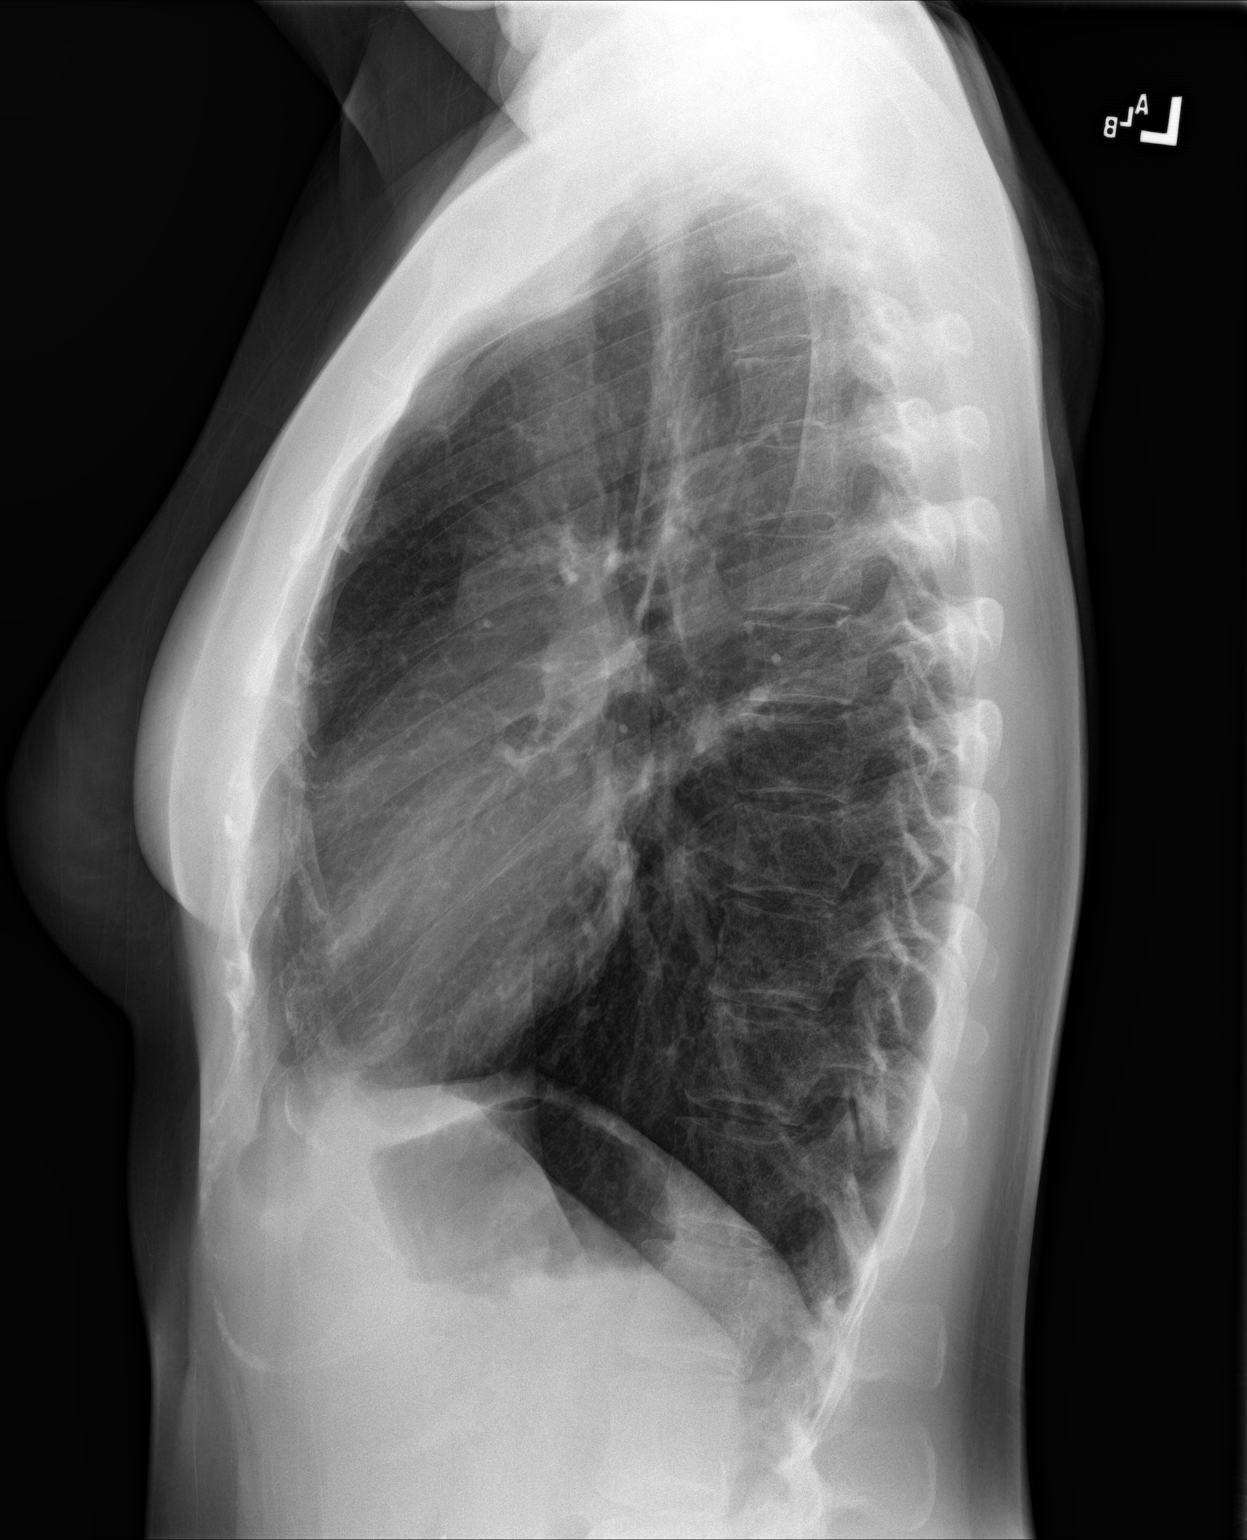

[2 of 2 positions shown; findings below may reference images not displayed]

FINDINGS: Heart size is normal. Mediastinal shadows are normal. There may be
mild central bronchial thickening but there is no infiltrate,
collapse or effusion. No significant bone finding.
IMPRESSION: No active disease versus mild bronchitis pattern.

## 2020-01-11 DIAGNOSIS — Z79899 Other long term (current) drug therapy: Secondary | ICD-10-CM | POA: Diagnosis not present

## 2020-01-11 DIAGNOSIS — M545 Low back pain: Secondary | ICD-10-CM | POA: Diagnosis not present

## 2020-01-11 DIAGNOSIS — N289 Disorder of kidney and ureter, unspecified: Secondary | ICD-10-CM | POA: Diagnosis not present

## 2020-01-11 DIAGNOSIS — F17210 Nicotine dependence, cigarettes, uncomplicated: Secondary | ICD-10-CM | POA: Diagnosis not present

## 2020-01-11 DIAGNOSIS — G8918 Other acute postprocedural pain: Secondary | ICD-10-CM | POA: Diagnosis not present

## 2020-01-11 DIAGNOSIS — N029 Recurrent and persistent hematuria with unspecified morphologic changes: Secondary | ICD-10-CM | POA: Diagnosis not present

## 2020-01-11 DIAGNOSIS — Z20822 Contact with and (suspected) exposure to covid-19: Secondary | ICD-10-CM | POA: Diagnosis not present

## 2020-01-11 DIAGNOSIS — I10 Essential (primary) hypertension: Secondary | ICD-10-CM | POA: Diagnosis not present

## 2020-01-11 DIAGNOSIS — G8929 Other chronic pain: Secondary | ICD-10-CM | POA: Diagnosis not present

## 2020-01-11 DIAGNOSIS — F419 Anxiety disorder, unspecified: Secondary | ICD-10-CM | POA: Diagnosis not present

## 2020-01-11 DIAGNOSIS — R109 Unspecified abdominal pain: Secondary | ICD-10-CM | POA: Diagnosis not present

## 2020-01-11 DIAGNOSIS — Z882 Allergy status to sulfonamides status: Secondary | ICD-10-CM | POA: Diagnosis not present

## 2020-01-11 DIAGNOSIS — Z881 Allergy status to other antibiotic agents status: Secondary | ICD-10-CM | POA: Diagnosis not present

## 2020-01-11 DIAGNOSIS — Z940 Kidney transplant status: Secondary | ICD-10-CM | POA: Diagnosis not present

## 2020-01-11 DIAGNOSIS — R319 Hematuria, unspecified: Secondary | ICD-10-CM | POA: Diagnosis not present

## 2020-01-14 DIAGNOSIS — M545 Low back pain: Secondary | ICD-10-CM | POA: Diagnosis not present

## 2020-01-14 DIAGNOSIS — R319 Hematuria, unspecified: Secondary | ICD-10-CM | POA: Diagnosis not present

## 2020-01-14 DIAGNOSIS — G8918 Other acute postprocedural pain: Secondary | ICD-10-CM | POA: Diagnosis not present

## 2020-01-14 DIAGNOSIS — Z940 Kidney transplant status: Secondary | ICD-10-CM | POA: Diagnosis not present

## 2020-01-15 DIAGNOSIS — R319 Hematuria, unspecified: Secondary | ICD-10-CM | POA: Diagnosis not present

## 2020-01-15 DIAGNOSIS — M545 Low back pain: Secondary | ICD-10-CM | POA: Diagnosis not present

## 2020-01-15 DIAGNOSIS — G8918 Other acute postprocedural pain: Secondary | ICD-10-CM | POA: Diagnosis not present

## 2020-01-15 DIAGNOSIS — Z940 Kidney transplant status: Secondary | ICD-10-CM | POA: Diagnosis not present

## 2020-01-15 DIAGNOSIS — N029 Recurrent and persistent hematuria with unspecified morphologic changes: Secondary | ICD-10-CM | POA: Diagnosis not present

## 2020-01-15 DIAGNOSIS — R109 Unspecified abdominal pain: Secondary | ICD-10-CM | POA: Diagnosis not present

## 2020-01-18 DIAGNOSIS — M545 Low back pain: Secondary | ICD-10-CM | POA: Diagnosis not present

## 2020-01-18 DIAGNOSIS — R319 Hematuria, unspecified: Secondary | ICD-10-CM | POA: Diagnosis not present

## 2020-01-19 DIAGNOSIS — M545 Low back pain: Secondary | ICD-10-CM | POA: Diagnosis not present

## 2020-01-19 DIAGNOSIS — R319 Hematuria, unspecified: Secondary | ICD-10-CM | POA: Diagnosis not present

## 2020-01-20 DIAGNOSIS — G8918 Other acute postprocedural pain: Secondary | ICD-10-CM | POA: Diagnosis not present

## 2020-01-24 DIAGNOSIS — Z940 Kidney transplant status: Secondary | ICD-10-CM | POA: Diagnosis not present

## 2020-01-24 DIAGNOSIS — M545 Low back pain: Secondary | ICD-10-CM | POA: Diagnosis not present

## 2020-01-24 DIAGNOSIS — R319 Hematuria, unspecified: Secondary | ICD-10-CM | POA: Diagnosis not present

## 2020-02-14 DIAGNOSIS — Z940 Kidney transplant status: Secondary | ICD-10-CM | POA: Diagnosis not present

## 2020-02-14 DIAGNOSIS — R319 Hematuria, unspecified: Secondary | ICD-10-CM | POA: Diagnosis not present

## 2020-02-14 DIAGNOSIS — M545 Low back pain: Secondary | ICD-10-CM | POA: Diagnosis not present

## 2020-02-14 DIAGNOSIS — R1030 Lower abdominal pain, unspecified: Secondary | ICD-10-CM | POA: Diagnosis not present

## 2020-02-18 DIAGNOSIS — G894 Chronic pain syndrome: Secondary | ICD-10-CM | POA: Diagnosis not present

## 2020-03-09 DIAGNOSIS — Z86018 Personal history of other benign neoplasm: Secondary | ICD-10-CM | POA: Diagnosis not present

## 2020-03-09 DIAGNOSIS — Z85828 Personal history of other malignant neoplasm of skin: Secondary | ICD-10-CM | POA: Diagnosis not present

## 2020-03-09 DIAGNOSIS — L578 Other skin changes due to chronic exposure to nonionizing radiation: Secondary | ICD-10-CM | POA: Diagnosis not present

## 2020-03-09 DIAGNOSIS — Z872 Personal history of diseases of the skin and subcutaneous tissue: Secondary | ICD-10-CM | POA: Diagnosis not present

## 2020-03-16 DIAGNOSIS — Z940 Kidney transplant status: Secondary | ICD-10-CM | POA: Diagnosis not present

## 2020-03-31 DIAGNOSIS — M545 Low back pain: Secondary | ICD-10-CM | POA: Diagnosis not present

## 2020-03-31 DIAGNOSIS — R319 Hematuria, unspecified: Secondary | ICD-10-CM | POA: Diagnosis not present

## 2020-04-09 ENCOUNTER — Other Ambulatory Visit: Payer: Self-pay

## 2020-04-09 ENCOUNTER — Ambulatory Visit: Payer: BC Managed Care – PPO | Admitting: Nurse Practitioner

## 2020-04-09 ENCOUNTER — Encounter: Payer: Self-pay | Admitting: Nurse Practitioner

## 2020-04-09 VITALS — BP 115/92 | HR 83 | Resp 16 | Ht 66.0 in | Wt 147.8 lb

## 2020-04-09 DIAGNOSIS — L7682 Other postprocedural complications of skin and subcutaneous tissue: Secondary | ICD-10-CM

## 2020-04-09 DIAGNOSIS — N958 Other specified menopausal and perimenopausal disorders: Secondary | ICD-10-CM

## 2020-04-09 DIAGNOSIS — M792 Neuralgia and neuritis, unspecified: Secondary | ICD-10-CM

## 2020-04-09 MED ORDER — LIDOCAINE 5 % EX PTCH: 1.0000 | MEDICATED_PATCH | CUTANEOUS | 5 refills | Status: DC

## 2020-04-09 MED ORDER — GABAPENTIN 300 MG PO CAPS: 300.0000 mg | ORAL_CAPSULE | Freq: Three times a day (TID) | ORAL | 3 refills | Status: DC

## 2020-04-09 MED ORDER — EST ESTROGENS-METHYLTEST 1.25-2.5 MG PO TABS: 1.0000 | ORAL_TABLET | Freq: Every day | ORAL | 5 refills | Status: DC

## 2020-04-09 NOTE — Progress Notes (Signed)
[facility] [address] [place], [place] [address]  Internal MEDICINE  Office Visit Note  Patient Name: Sara Peck  [id number]  [id number]  Date of Service: 04/12/2020  Chief Complaint  Patient presents with  . Follow-up  . Medication Management    estrogen, still good to be taking it?    The patient is here for routine follow up. She recently had renal autotransplantation of her fight kidney. Is doing relatively well. Pain she had been experiencing is improved. She does have some nerve pain and some pain around incision in right groin area. She goes back for check up with transplant team tomorrow. She has been able to cut down on seeing pain management provider. She was started on gabapentin 300mg  three times daily to help with the nerve pain she has been experiencing since the surgery. She also uses lidocaine patches per prescription. These are not covered by her insurance, but less expensive to get prescription then to get over the counter. She would like for me to continue prescriptions for gabapentin and lidocaine.  She is due to have refills of her hormone supplementation as well.       Current Medication: Outpatient Encounter Medications as of 04/09/2020  Medication Sig  . clorazepate (TRANXENE) 7.5 MG tablet TAKE 1 TABLET BY MOUTH TWICE A DAY AS NEEDED FOR ANXIETY  . estrogens-methylTEST (ESTRATEST) 1.25-2.5 MG tablet Take 1 tablet by mouth daily.  06/09/2020 gabapentin (NEURONTIN) 300 MG capsule Take 1 capsule (300 mg total) by mouth 3 (three) times daily.  Marland Kitchen lidocaine (LIDODERM) 5 % Place 1 patch onto the skin daily. Remove & Discard patch within 12 hours or as directed by MD  . Oxycodone HCl 10 MG TABS Take 10 mg by mouth every 4 (four) hours as needed (every 4-6 hours PRN.  Pt usually uses 3 times/day).  . [DISCONTINUED] estrogens-methylTEST (ESTRATEST) 1.25-2.5 MG tablet Take 1 tablet by mouth daily.  . [DISCONTINUED] gabapentin (NEURONTIN) 300 MG capsule Take 300  mg by mouth 3 (three) times daily.  . [DISCONTINUED] lidocaine (LIDODERM) 5 % Place 1 patch onto the skin daily. Remove & Discard patch within 12 hours or as directed by MD  . [DISCONTINUED] amoxicillin-clavulanate (AUGMENTIN) 875-125 MG tablet Take 1 tablet by mouth 2 (two) times daily.  . [DISCONTINUED] lisinopril (PRINIVIL,ZESTRIL) 5 MG tablet Take 5 mg by mouth daily.  . [DISCONTINUED] predniSONE (DELTASONE) 50 MG tablet 1 tablet daily x 5 days (Patient not taking: Reported on 04/09/2020)   No facility-administered encounter medications on file as of 04/09/2020.    Surgical History: Past Surgical History:  Procedure Laterality Date  . ABDOMINAL HYSTERECTOMY  12-27-13  . APPENDECTOMY  12-27-13  . COLONOSCOPY  2014   [facility]  . LAPAROSCOPY     multiple  . MOLE REMOVAL    . MYRINGOTOMY WITH TUBE PLACEMENT Bilateral 03/18/2016   Procedure: MYRINGOTOMY WITH TUBE PLACEMENT;  Surgeon: 05/18/2016, MD;  Location: [facility];  Service: ENT;  Laterality: Bilateral;  BUTTERFLY TUBES  . NASAL ENDOSCOPY  2014   [facility]  . PARTIAL HYSTERECTOMY  Jan 2012   with left ovary/tube  . SKIN LESION EXCISION  2015   back  . TYMPANOSTOMY TUBE PLACEMENT  Jan 2012    Medical History: Past Medical History:  Diagnosis Date  . ADD (attention deficit disorder)   . Chronic kidney disease   . Endometriosis   . Headache    rare migraines.  stress HA more often  .  Right flank pain    Sees Dr. Linton Ham at [facility]  . Thin basement membrane disease    sees Lisbeth Ply, MD at [facility]    Family History: Family History  Problem Relation Age of Onset  . Cancer Mother        thyroid    Social History   Socioeconomic History  . Marital status: Married    Spouse name: Not on file  . Number of children: Not on file  . Years of education: Not on file  . Highest education level: Not on file  Occupational History  . Not on file  Tobacco Use  . Smoking status: Former Smoker    Packs/day: 0.50     Years: 5.00    Pack years: 2.50    Types: Cigarettes  . Smokeless tobacco: Never Used  Substance and Sexual Activity  . Alcohol use: Yes    Comment: occasionally beer (holidays, special occasions)  . Drug use: No  . Sexual activity: Not on file  Other Topics Concern  . Not on file  Social History Narrative  . Not on file   Social Determinants of Health   Financial Resource Strain:   . Difficulty of Paying Living Expenses:   Food Insecurity:   . Worried About Programme researcher, broadcasting/film/video in the Last Year:   . Barista in the Last Year:   Transportation Needs:   . Freight forwarder (Medical):   Marland Kitchen Lack of Transportation (Non-Medical):   Physical Activity:   . Days of Exercise per Week:   . Minutes of Exercise per Session:   Stress:   . Feeling of Stress :   Social Connections:   . Frequency of Communication with Friends and Family:   . Frequency of Social Gatherings with Friends and Family:   . Attends Religious Services:   . Active Member of Clubs or Organizations:   . Attends Banker Meetings:   Marland Kitchen Marital Status:   Intimate Partner Violence:   . Fear of Current or Ex-Partner:   . Emotionally Abused:   Marland Kitchen Physically Abused:   . Sexually Abused:       Review of Systems  Constitutional: Negative for activity change, chills, fatigue and unexpected weight change.  HENT: Negative for congestion, postnasal drip, rhinorrhea, sneezing and sore throat.   Respiratory: Negative for cough, chest tightness and shortness of breath.   Cardiovascular: Negative for chest pain and palpitations.  Gastrointestinal: Negative for abdominal pain, constipation, diarrhea, nausea and vomiting.  Endocrine: Negative for cold intolerance, heat intolerance, polydipsia and polyuria.  Genitourinary: Positive for flank pain. Negative for dysuria, frequency, hematuria and pelvic pain.  Musculoskeletal: Positive for back pain. Negative for arthralgias, joint swelling and neck pain.   Skin: Negative for color change, pallor, rash and wound.  Allergic/Immunologic: Negative for environmental allergies.  Neurological: Negative for dizziness, tremors, numbness and headaches.  Hematological: Negative for adenopathy. Does not bruise/bleed easily.  Psychiatric/Behavioral: Negative for behavioral problems (Depression), sleep disturbance and suicidal ideas. The patient is nervous/anxious.     Today's Vitals   04/09/20 0959  BP: (!) 115/92  Pulse: 83  Resp: 16  SpO2: 97%  Weight: 147 lb 12.8 oz (67 kg)  Height: 5\' 6"  (1.676 m)   Body mass index is 23.86 kg/m.  Physical Exam Vitals and nursing note reviewed.  Constitutional:      General: She is not in acute distress.    Appearance: Normal appearance. She is well-developed. She  is not diaphoretic.  HENT:     Head: Normocephalic and atraumatic.     Nose: Nose normal.     Mouth/Throat:     Pharynx: No oropharyngeal exudate.  Eyes:     Conjunctiva/sclera: Conjunctivae normal.     Pupils: Pupils are equal, round, and reactive to light.  Neck:     Thyroid: No thyromegaly.     Vascular: No JVD.     Trachea: No tracheal deviation.  Cardiovascular:     Rate and Rhythm: Normal rate and regular rhythm.     Heart sounds: Normal heart sounds. No murmur. No friction rub. No gallop.   Pulmonary:     Effort: Pulmonary effort is normal. No respiratory distress.     Breath sounds: Normal breath sounds. No rales.  Chest:     Chest wall: No tenderness.  Abdominal:     Palpations: Abdomen is soft.     Tenderness: There is abdominal tenderness.  Musculoskeletal:        General: Normal range of motion.     Cervical back: Normal range of motion and neck supple.  Lymphadenopathy:     Cervical: No cervical adenopathy.  Skin:    General: Skin is warm and dry.     Findings: Rash present.  Neurological:     Mental Status: She is alert and oriented to person, place, and time.     Cranial Nerves: No cranial nerve deficit.   Psychiatric:        Mood and Affect: Mood normal.        Behavior: Behavior normal.        Thought Content: Thought content normal.        Judgment: Judgment normal.   Assessment/Plan: 1. Incisional pain Renew prescription for lidoderm patches. May apply one patch to effected area for 12 hours daily. Continue gabapentin 300mg  up to three times daily as needed for neuropathic pain from incision.  - lidocaine (LIDODERM) 5 %; Place 1 patch onto the skin daily. Remove & Discard patch within 12 hours or as directed by MD  Dispense: 30 patch; Refill: 5 - gabapentin (NEURONTIN) 300 MG capsule; Take 1 capsule (300 mg total) by mouth 3 (three) times daily.  Dispense: 90 capsule; Refill: 3  2. Neuropathic pain Continue gabapentin 300mg  up to three times daily as needed for neuropathic pain from incision.  - gabapentin (NEURONTIN) 300 MG capsule; Take 1 capsule (300 mg total) by mouth 3 (three) times daily.  Dispense: 90 capsule; Refill: 3  3. Artificial menopause Continue esratest as prescribed. Refills provided today.  - estrogens-methylTEST (ESTRATEST) 1.25-2.5 MG tablet; Take 1 tablet by mouth daily.  Dispense: 30 tablet; Refill: 5  General Counseling: Sara Peck verbalizes understanding of the findings of todays visit and agrees with plan of treatment. I have discussed any further diagnostic evaluation that may be needed or ordered today. We also reviewed her medications today. she has been encouraged to call the office with any questions or concerns that should arise related to todays visit.  This patient was seen by FNP Collaboration with Dr as a part of collaborative care agreement  Meds ordered this encounter  Medications  . estrogens-methylTEST (ESTRATEST) 1.25-2.5 MG tablet    Sig: Take 1 tablet by mouth daily.    Dispense:  30 tablet    Refill:  5    Order Specific Question:   Supervising Provider    Answer:   Vincent Gros [[id number]]  . lidocaine (  LIDODERM) 5 %     Sig: Place 1 patch onto the skin daily. Remove & Discard patch within 12 hours or as directed by MD    Dispense:  30 patch    Refill:  5    Order Specific Question:   Supervising Provider    Answer:   Lavera Guise [place]  . gabapentin (NEURONTIN) 300 MG capsule    Sig: Take 1 capsule (300 mg total) by mouth 3 (three) times daily.    Dispense:  90 capsule    Refill:  3    Written prescription given to patient today.    Order Specific Question:   Supervising Provider    Answer:   Lavera Guise [[id number]]    Total time spent: 30 Minutes   Time spent includes review of chart, medications, test results, and follow up plan with the patient.      Dr Lavera Guise Internal medicine

## 2020-04-10 DIAGNOSIS — Z940 Kidney transplant status: Secondary | ICD-10-CM | POA: Diagnosis not present

## 2020-04-10 DIAGNOSIS — M545 Low back pain: Secondary | ICD-10-CM | POA: Diagnosis not present

## 2020-04-10 DIAGNOSIS — R319 Hematuria, unspecified: Secondary | ICD-10-CM | POA: Diagnosis not present

## 2020-04-12 DIAGNOSIS — L7682 Other postprocedural complications of skin and subcutaneous tissue: Secondary | ICD-10-CM | POA: Insufficient documentation

## 2020-04-12 DIAGNOSIS — M792 Neuralgia and neuritis, unspecified: Secondary | ICD-10-CM | POA: Insufficient documentation

## 2020-04-14 DIAGNOSIS — H9210 Otorrhea, unspecified ear: Secondary | ICD-10-CM | POA: Diagnosis not present

## 2020-04-14 DIAGNOSIS — H6982 Other specified disorders of Eustachian tube, left ear: Secondary | ICD-10-CM | POA: Diagnosis not present

## 2020-04-14 DIAGNOSIS — H9212 Otorrhea, left ear: Secondary | ICD-10-CM | POA: Diagnosis not present

## 2020-04-27 DIAGNOSIS — H9212 Otorrhea, left ear: Secondary | ICD-10-CM | POA: Diagnosis not present

## 2020-04-28 ENCOUNTER — Telehealth: Payer: Self-pay

## 2020-04-28 NOTE — Telephone Encounter (Signed)
Authorization approved for Lidocaine 5% patches from 04/09/2020 through 04/09/2021 [facility]

## 2020-05-27 DIAGNOSIS — R319 Hematuria, unspecified: Secondary | ICD-10-CM | POA: Diagnosis not present

## 2020-05-27 DIAGNOSIS — M545 Low back pain: Secondary | ICD-10-CM | POA: Diagnosis not present

## 2020-05-27 DIAGNOSIS — N390 Urinary tract infection, site not specified: Secondary | ICD-10-CM | POA: Diagnosis not present

## 2020-06-15 DIAGNOSIS — N390 Urinary tract infection, site not specified: Secondary | ICD-10-CM | POA: Diagnosis not present

## 2020-07-14 ENCOUNTER — Telehealth: Payer: Self-pay

## 2020-07-14 NOTE — Telephone Encounter (Signed)
LMOM for OV on 9/10 °

## 2020-07-17 ENCOUNTER — Other Ambulatory Visit: Payer: BC Managed Care – PPO | Admitting: Nurse Practitioner

## 2020-07-21 ENCOUNTER — Telehealth: Payer: Self-pay

## 2020-07-21 NOTE — Telephone Encounter (Signed)
BILLED MISSED APPT FEE 07/17/20

## 2020-08-27 DIAGNOSIS — H6123 Impacted cerumen, bilateral: Secondary | ICD-10-CM | POA: Diagnosis not present

## 2020-08-27 DIAGNOSIS — J300 Vasomotor rhinitis: Secondary | ICD-10-CM | POA: Diagnosis not present

## 2020-09-23 ENCOUNTER — Other Ambulatory Visit: Payer: Self-pay | Admitting: Nurse Practitioner

## 2020-09-23 DIAGNOSIS — N958 Other specified menopausal and perimenopausal disorders: Secondary | ICD-10-CM

## 2020-09-23 MED ORDER — EST ESTROGENS-METHYLTEST 1.25-2.5 MG PO TABS: 1.0000 | ORAL_TABLET | Freq: Every day | ORAL | 1 refills | Status: DC

## 2020-10-23 ENCOUNTER — Encounter: Payer: Self-pay | Admitting: Nurse Practitioner

## 2020-10-23 ENCOUNTER — Other Ambulatory Visit: Payer: Self-pay

## 2020-10-23 ENCOUNTER — Ambulatory Visit (INDEPENDENT_AMBULATORY_CARE_PROVIDER_SITE_OTHER): Payer: BC Managed Care – PPO | Admitting: Nurse Practitioner

## 2020-10-23 VITALS — BP 118/76 | HR 87 | Temp 97.6°F | Resp 16 | Ht 65.5 in | Wt 141.4 lb

## 2020-10-23 DIAGNOSIS — F411 Generalized anxiety disorder: Secondary | ICD-10-CM

## 2020-10-23 DIAGNOSIS — R300 Dysuria: Secondary | ICD-10-CM | POA: Diagnosis not present

## 2020-10-23 DIAGNOSIS — R319 Hematuria, unspecified: Secondary | ICD-10-CM

## 2020-10-23 DIAGNOSIS — Z124 Encounter for screening for malignant neoplasm of cervix: Secondary | ICD-10-CM | POA: Diagnosis not present

## 2020-10-23 DIAGNOSIS — N390 Urinary tract infection, site not specified: Secondary | ICD-10-CM | POA: Diagnosis not present

## 2020-10-23 DIAGNOSIS — Z0001 Encounter for general adult medical examination with abnormal findings: Secondary | ICD-10-CM | POA: Diagnosis not present

## 2020-10-23 DIAGNOSIS — N958 Other specified menopausal and perimenopausal disorders: Secondary | ICD-10-CM

## 2020-10-23 LAB — POCT URINALYSIS DIPSTICK
Bilirubin, UA: NEGATIVE
Glucose, UA: NEGATIVE
Ketones, UA: NEGATIVE
Leukocytes, UA: NEGATIVE
Nitrite, UA: NEGATIVE
Protein, UA: NEGATIVE
Spec Grav, UA: 1.015 (ref 1.010–1.025)
Urobilinogen, UA: NEGATIVE E.U./dL — AB
pH, UA: 6 (ref 5.0–8.0)

## 2020-10-23 MED ORDER — EST ESTROGENS-METHYLTEST 1.25-2.5 MG PO TABS: 1.0000 | ORAL_TABLET | Freq: Every day | ORAL | 2 refills | Status: DC

## 2020-10-23 MED ORDER — CLORAZEPATE DIPOTASSIUM 7.5 MG PO TABS: ORAL_TABLET | ORAL | 2 refills | Status: DC

## 2020-10-23 NOTE — Progress Notes (Signed)
[facility] [address] [place], [place] [address]  Internal MEDICINE  Office Visit Note  Patient Name: Sara Peck  [id number]  [id number]  Date of Service: 11/24/2020   Pt is here for routine health maintenance examination  Chief Complaint  Patient presents with  . Annual Exam    Refill request  . ADD     The patient presents for health maintenance exam and pap smear.  - had renal autotransplantation of her fight kidney. Is doing relatively well. Pain she had been experiencing is improved. -recently experiencing urinary frequency and urgency. Often, this has been symptoms of UTI in the past. Her urine sample is positive for large amount of blood.  -has had complete hysterectomy, however, she has had cervical cells on prior pap smears which were after the hysterectomy.  -needs to have refills of hormone replacement -does have some intermittent anxiety. Takes tranxene when needed. She needs to have a refill for this as well.  -due to have routine, fasting labs     Current Medication: Outpatient Encounter Medications as of 10/23/2020  Medication Sig  . ciprofloxacin (CIPRO) 500 MG tablet Take 500 mg by mouth 2 (two) times daily.  Marland Kitchen gabapentin (NEURONTIN) 300 MG capsule Take 1 capsule (300 mg total) by mouth 3 (three) times daily.  . [DISCONTINUED] clorazepate (TRANXENE) 7.5 MG tablet TAKE 1 TABLET BY MOUTH TWICE A DAY AS NEEDED FOR ANXIETY  . [DISCONTINUED] estrogens-methylTEST (ESTRATEST) 1.25-2.5 MG tablet Take 1 tablet by mouth daily.  . clorazepate (TRANXENE) 7.5 MG tablet TAKE 1 TABLET BY MOUTH TWICE A DAY AS NEEDED FOR ANXIETY  . estrogens-methylTEST (ESTRATEST) 1.25-2.5 MG tablet Take 1 tablet by mouth daily.  Marland Kitchen lidocaine (LIDODERM) 5 % Place 1 patch onto the skin daily. Remove & Discard patch within 12 hours or as directed by MD (Patient not taking: Reported on 10/23/2020)  . Oxycodone HCl 10 MG TABS Take 10 mg by mouth every 4 (four) hours as needed  (every 4-6 hours PRN.  Pt usually uses 3 times/day). (Patient not taking: Reported on 10/23/2020)   No facility-administered encounter medications on file as of 10/23/2020.    Surgical History: Past Surgical History:  Procedure Laterality Date  . ABDOMINAL HYSTERECTOMY  12-27-13  . APPENDECTOMY  12-27-13  . COLONOSCOPY  2014   [facility]  . LAPAROSCOPY     multiple  . MOLE REMOVAL    . MYRINGOTOMY WITH TUBE PLACEMENT Bilateral 03/18/2016   Procedure: MYRINGOTOMY WITH TUBE PLACEMENT;  Surgeon: Linus Salmons, MD;  Location: [facility];  Service: ENT;  Laterality: Bilateral;  BUTTERFLY TUBES  . NASAL ENDOSCOPY  2014   [facility]  . PARTIAL HYSTERECTOMY  Jan 2012   with left ovary/tube  . renal autotransplantion    . SKIN LESION EXCISION  2015   back  . TYMPANOSTOMY TUBE PLACEMENT  Jan 2012    Medical History: Past Medical History:  Diagnosis Date  . ADD (attention deficit disorder)   . Chronic kidney disease   . Endometriosis   . Headache    rare migraines.  stress HA more often  . Right flank pain    Sees Dr. Linton Ham at [facility]  . Thin basement membrane disease    sees Lisbeth Ply, MD at [facility]    Family History: Family History  Problem Relation Age of Onset  . Cancer Mother        thyroid      Review of Systems  Constitutional: Negative  for activity change, chills, fatigue and unexpected weight change.  HENT: Negative for congestion, postnasal drip, rhinorrhea, sneezing and sore throat.   Respiratory: Negative for cough, chest tightness, shortness of breath and wheezing.   Cardiovascular: Negative for chest pain and palpitations.  Gastrointestinal: Negative for abdominal pain, constipation, diarrhea, nausea and vomiting.  Endocrine: Negative for cold intolerance, heat intolerance, polydipsia and polyuria.  Genitourinary: Positive for dysuria, frequency and urgency. Negative for flank pain, hematuria and pelvic pain.  Musculoskeletal: Positive for back pain.  Negative for arthralgias, joint swelling and neck pain.  Skin: Negative for color change, pallor, rash and wound.  Allergic/Immunologic: Negative for environmental allergies.  Neurological: Negative for dizziness, tremors, numbness and headaches.  Hematological: Negative for adenopathy. Does not bruise/bleed easily.  Psychiatric/Behavioral: Negative for behavioral problems (Depression), sleep disturbance and suicidal ideas. The patient is nervous/anxious.      Today's Vitals   10/23/20 1045  BP: 118/76  Pulse: 87  Resp: 16  Temp: 97.6 F (36.4 C)  SpO2: 99%  Weight: 141 lb 6.4 oz (64.1 kg)  Height: 5' 5.5" (1.664 m)   Body mass index is 23.17 kg/m.  Physical Exam Vitals and nursing note reviewed.  Constitutional:      General: She is not in acute distress.    Appearance: Normal appearance. She is well-developed and well-nourished. She is not diaphoretic.  HENT:     Head: Normocephalic and atraumatic.     Nose: Nose normal.     Mouth/Throat:     Mouth: Oropharynx is clear and moist.     Pharynx: No oropharyngeal exudate.  Eyes:     Extraocular Movements: EOM normal.     Pupils: Pupils are equal, round, and reactive to light.  Neck:     Thyroid: No thyromegaly.     Vascular: No JVD.     Trachea: No tracheal deviation.  Cardiovascular:     Rate and Rhythm: Normal rate and regular rhythm.     Pulses: Normal pulses.     Heart sounds: Normal heart sounds. No murmur heard. No friction rub. No gallop.   Pulmonary:     Effort: Pulmonary effort is normal. No respiratory distress.     Breath sounds: Normal breath sounds. No wheezing or rales.  Chest:     Chest wall: No tenderness.  Breasts:     Right: Normal. No swelling, bleeding, inverted nipple, mass, nipple discharge, skin change, tenderness or axillary adenopathy.     Left: Normal. No swelling, bleeding, inverted nipple, mass, nipple discharge, skin change, tenderness or axillary adenopathy.    Abdominal:      General: Bowel sounds are normal.     Palpations: Abdomen is soft.     Tenderness: There is no abdominal tenderness.  Genitourinary:    General: Normal vulva.     Exam position: Supine.     Labia:        Right: No rash, tenderness, lesion or injury.        Left: No rash, tenderness, lesion or injury.      Vagina: Normal. No vaginal discharge, erythema, tenderness or bleeding.     Uterus: Absent.      Adnexa: Right adnexa normal and left adnexa normal.     Comments: No tenderness, masses, or organomeglay present during bimanual exam . Musculoskeletal:        General: Normal range of motion.     Cervical back: Normal range of motion and neck supple.  Lymphadenopathy:     Cervical:  No cervical adenopathy.     Upper Body:     Right upper body: No axillary adenopathy.     Left upper body: No axillary adenopathy.     Lower Body: No right inguinal adenopathy. No left inguinal adenopathy.  Skin:    General: Skin is warm and dry.     Capillary Refill: Capillary refill takes less than 2 seconds.  Neurological:     General: No focal deficit present.     Mental Status: She is alert and oriented to person, place, and time.     Cranial Nerves: No cranial nerve deficit.  Psychiatric:        Mood and Affect: Mood and affect and mood normal.        Behavior: Behavior normal.        Thought Content: Thought content normal.        Judgment: Judgment normal.      LABS: Recent Results (from the past 2160 hour(s))  CULTURE, URINE COMPREHENSIVE     Status: Abnormal   Collection Time: 10/23/20 10:43 AM   Specimen: Urine   Urine  Result Value Ref Range   Urine Culture, Comprehensive Final report (A)    Organism ID, Bacteria Enterococcus faecalis (A)     Comment: For Enterococcus species, aminoglycosides (except for high-level resistance screening), cephalosporins, clindamycin, and trimethoprim-sulfamethoxazole are not effective clinically. (CLSI, M100-S26, 2016) 50,000-100,000 colony  forming units per mL    ANTIMICROBIAL SUSCEPTIBILITY Comment     Comment:       ** S = Susceptible; I = Intermediate; R = Resistant **                    P = Positive; N = Negative             MICS are expressed in micrograms per mL    Antibiotic                 RSLT#1    RSLT#2    RSLT#3    RSLT#4 Ciprofloxacin                  S Levofloxacin                   S Nitrofurantoin                 S Penicillin                     S Tetracycline                   R Vancomycin                     S   POCT Urinalysis Dipstick     Status: Abnormal   Collection Time: 10/23/20  3:02 PM  Result Value Ref Range   Color, UA     Clarity, UA     Glucose, UA Negative Negative   Bilirubin, UA negative    Ketones, UA negative    Spec Grav, UA 1.015 1.010 - 1.025   Blood, UA large    pH, UA 6.0 5.0 - 8.0   Protein, UA Negative Negative   Urobilinogen, UA negative (A) 0.2 or 1.0 E.U./dL   Nitrite, UA negative    Leukocytes, UA Negative Negative   Appearance     Odor    IGP, Aptima HPV     Status: None   Collection Time:  10/26/20  9:30 AM  Result Value Ref Range   Interpretation NILM     Comment: NEGATIVE FOR INTRAEPITHELIAL LESION OR MALIGNANCY.   Category NIL     Comment: Negative for Intraepithelial Lesion   Adequacy ENDO     Comment: Satisfactory for evaluation. Endocervical and/or squamous metaplastic cells (endocervical component) are present.    Clinician Provided ICD10 Comment     Comment: Z12.4   Performed by: Comment     Comment: Thomes Dinning, Cytotechnologist (ASCP)   Note: Comment     Comment: The Pap smear is a screening test designed to aid in the detection of premalignant and malignant conditions of the uterine cervix.  It is not a diagnostic procedure and should not be used as the sole means of detecting cervical cancer.  Both false-positive and false-negative reports do occur.    Test Methodology Comment     Comment: This liquid based ThinPrep(R) pap test was screened  with the use of an image guided system.    HPV Aptima Negative Negative    Comment: This nucleic acid amplification test detects fourteen high-risk HPV types (16,18,31,33,35,39,45,51,52,56,58,59,66,68) without differentiation.   POCT rapid strep A     Status: None   Collection Time: 11/12/20  2:33 PM  Result Value Ref Range   Rapid Strep A Screen Negative Negative  Culture, group A strep     Status: None   Collection Time: 11/12/20  2:38 PM   Specimen: Throat  Result Value Ref Range   Specimen Description THROAT    Special Requests NONE    Culture      NO GROUP A STREP (S.PYOGENES) ISOLATED Performed at [facility], [address], [place], [place] [address]    Report Status 11/15/2020 FINAL   Novel Coronavirus, NAA ([facility])     Status: None   Collection Time: 11/12/20  2:47 PM   Specimen: Nasopharyngeal(NP) swabs in vial transport medium   Nasopharynge  Result Value Ref Range   SARS-CoV-2, NAA Not Detected Not Detected    Comment: This nucleic acid amplification test was developed and its performance characteristics determined by [employer]. Nucleic acid amplification tests include RT-PCR and TMA. This test has not been FDA cleared or approved. This test has been authorized by FDA under an Emergency Use Authorization (EUA). This test is only authorized for the duration of time the declaration that circumstances exist justifying the authorization of the emergency use of in vitro diagnostic tests for detection of SARS-CoV-2 virus and/or diagnosis of COVID-19 infection under section 564(b)(1) of the Act, 21 U.S.C. 016WFU-9(N) (1), unless the authorization is terminated or revoked sooner. When diagnostic testing is negative, the possibility of a false negative result should be considered in the context of a patient's recent exposures and the presence of clinical signs and symptoms consistent with COVID-19. An individual without symptoms of COVID-19 and who  is not shedding SARS-CoV-2 virus wo uld expect to have a negative (not detected) result in this assay.   SARS-COV-2, NAA 2 DAY TAT     Status: None   Collection Time: 11/12/20  2:47 PM   Nasopharynge  Result Value Ref Range   SARS-CoV-2, NAA 2 DAY TAT Performed     Assessment/Plan: 1. Encounter for general adult medical examination with abnormal findings Annual health maintenance exam and pap smear today.   2. Artificial menopause Continue estratest as previously prescribed. Refills provided today.  - estrogens-methylTEST (ESTRATEST) 1.25-2.5 MG tablet; Take 1 tablet by mouth daily.  Dispense: 30 tablet; Refill: 2  3. GAD (generalized anxiety disorder) May take tranxene 7.5mg  up to twice daily as needed for acute anxiety. Refills provided today.  - clorazepate (TRANXENE) 7.5 MG tablet; TAKE 1 TABLET BY MOUTH TWICE A DAY AS NEEDED FOR ANXIETY  Dispense: 60 tablet; Refill: 2  4. Urinary tract infection with hematuria, site unspecified Patient to take previously prescribed cipro twice daily for next 7 days. Will send urine for culture and sensitivity and adust dosing as indicated.   5. Routine cervical smear Pap smear today.   6. Dysuria Patient to take previously prescribed cipro twice daily for next 7 days. Will send urine for culture and sensitivity and adust dosing as indicated.  - CULTURE, URINE COMPREHENSIVE - POCT Urinalysis Dipstick  General Counseling: Lanyiah verbalizes understanding of the findings of todays visit and agrees with plan of treatment. I have discussed any further diagnostic evaluation that may be needed or ordered today. We also reviewed her medications today. she has been encouraged to call the office with any questions or concerns that should arise related to todays visit.    Counseling:  This patient was seen by Vincent Gros FNP Collaboration with Dr Lyndon Code as a part of collaborative care agreement  Orders Placed This Encounter  Procedures  .  CULTURE, URINE COMPREHENSIVE  . POCT Urinalysis Dipstick    Meds ordered this encounter  Medications  . clorazepate (TRANXENE) 7.5 MG tablet    Sig: TAKE 1 TABLET BY MOUTH TWICE A DAY AS NEEDED FOR ANXIETY    Dispense:  60 tablet    Refill:  2    Order Specific Question:   Supervising Provider    Answer:   Lyndon Code [[id number]]  . estrogens-methylTEST (ESTRATEST) 1.25-2.5 MG tablet    Sig: Take 1 tablet by mouth daily.    Dispense:  30 tablet    Refill:  2    Order Specific Question:   Supervising Provider    Answer:   Lyndon Code [[id number]]    Total time spent: 45 Minutes  Time spent includes review of chart, medications, test results, and follow up plan with the patient.     Lyndon Code, MD  Internal Medicine

## 2020-10-26 ENCOUNTER — Ambulatory Visit: Payer: BC Managed Care – PPO

## 2020-10-26 DIAGNOSIS — Z124 Encounter for screening for malignant neoplasm of cervix: Secondary | ICD-10-CM | POA: Diagnosis not present

## 2020-10-26 NOTE — Addendum Note (Signed)
Addended by: Loura Back on: 10/26/2020 09:31 AM   Modules accepted: Orders

## 2020-10-29 LAB — IGP, APTIMA HPV: HPV Aptima: NEGATIVE

## 2020-10-30 LAB — CULTURE, URINE COMPREHENSIVE

## 2020-11-01 NOTE — Progress Notes (Signed)
Patient started on cipro at time of her visit.

## 2020-11-01 NOTE — Progress Notes (Signed)
Please let the patient know that her pap smear was normal. Thanks.

## 2020-11-12 ENCOUNTER — Ambulatory Visit: Admit: 2020-11-12 | Disposition: A | Discharge status: Home / Self Care | Payer: BC Managed Care – PPO

## 2020-11-12 ENCOUNTER — Encounter: Payer: Self-pay | Admitting: Family Medicine

## 2020-11-12 ENCOUNTER — Ambulatory Visit
Admission: EM | Admit: 2020-11-12 | Discharge: 2020-11-12 | Disposition: A | Discharge status: Home / Self Care | Payer: 59 | Attending: Family Medicine | Admitting: Family Medicine

## 2020-11-12 DIAGNOSIS — J029 Acute pharyngitis, unspecified: Secondary | ICD-10-CM | POA: Diagnosis present

## 2020-11-12 LAB — POCT RAPID STREP A (OFFICE): Rapid Strep A Screen: NEGATIVE

## 2020-11-12 NOTE — ED Triage Notes (Signed)
Pt presents with c/o sore throat. No other symptoms. Began a few days ago , h/o strep

## 2020-11-12 NOTE — Discharge Instructions (Addendum)
Strep test negative Covid test pending  Follow up as needed for continued or worsening symptoms

## 2020-11-12 NOTE — ED Provider Notes (Signed)
[facility]    CSN: [id number] Arrival date & time: 11/12/20  1358      History   Chief Complaint Chief Complaint  Patient presents with  . Sore Throat    HPI Sara Peck is a 40 y.o. female.   Pt is a 40 year old female presents today for sore throat.  This is been present, waxing waning for the past week.  Mild nasal congestion.  Daughter has been sick with similar symptoms.  No fever, chills, cough or chest congestion.     Past Medical History:  Diagnosis Date  . ADD (attention deficit disorder)   . Chronic kidney disease   . Endometriosis   . Headache    rare migraines.  stress HA more often  . Right flank pain    Sees Dr. Weyman Rodney at [facility]  . Thin basement membrane disease    sees Clarise Cruz, MD at [facility]    Patient Active Problem List   Diagnosis Date Noted  . Incisional pain 04/12/2020  . Neuropathic pain 04/12/2020  . Personal history of other malignant neoplasm of skin 10/23/2018  . Urinary tract infection with hematuria 07/22/2018  . Dysuria 07/22/2018  . Pain syndrome, chronic 02/16/2018  . GAD (generalized anxiety disorder) 12/15/2017  . Thin basement membrane nephropathy 04/12/2017  . Migraine without aura and without status migrainosus, not intractable 09/15/2016  . Endometriosis 01/19/2015  . Adult attention deficit disorder 12/24/2014  . Hematoma, postoperative 09/20/2014  . Microscopic hematuria 09/20/2014  . Abdominal pain, unspecified site 03/28/2014  . Artificial menopause 01/08/2014  . Encounter for general adult medical examination without abnormal findings 12/03/2012    Past Surgical History:  Procedure Laterality Date  . ABDOMINAL HYSTERECTOMY  12-27-13  . APPENDECTOMY  12-27-13  . COLONOSCOPY  2014   [facility]  . LAPAROSCOPY     multiple  . MOLE REMOVAL    . MYRINGOTOMY WITH TUBE PLACEMENT Bilateral 03/18/2016   Procedure: MYRINGOTOMY WITH TUBE PLACEMENT;  Surgeon: Beverly Gust, MD;  Location: [place];   Service: ENT;  Laterality: Bilateral;  BUTTERFLY TUBES  . NASAL ENDOSCOPY  2014   [facility]  . PARTIAL HYSTERECTOMY  Jan 2012   with left ovary/tube  . renal autotransplantion    . SKIN LESION EXCISION  2015   back  . TYMPANOSTOMY TUBE PLACEMENT  Jan 2012    OB History    Gravida  2   Para  2   Term      Preterm      AB      Living        SAB      IAB      Ectopic      Multiple      Live Births           Obstetric Comments  1st Menstrual Cycle:  15 1st Pregnancy:  28 Menopause age 54         Home Medications    Prior to Admission medications   Medication Sig Start Date End Date Taking? Authorizing Provider  ciprofloxacin (CIPRO) 500 MG tablet Take 500 mg by mouth 2 (two) times daily.    [provider]  clorazepate (TRANXENE) 7.5 MG tablet TAKE 1 TABLET BY MOUTH TWICE A DAY AS NEEDED FOR ANXIETY 10/23/20   Boscia, Heather E, NP  estrogens-methylTEST (ESTRATEST) 1.25-2.5 MG tablet Take 1 tablet by mouth daily. 10/23/20   Ronnell Freshwater, NP  gabapentin (NEURONTIN) 300 MG capsule  Take 1 capsule (300 mg total) by mouth 3 (three) times daily. 04/09/20   Carlean Jews, NP  lidocaine (LIDODERM) 5 % Place 1 patch onto the skin daily. Remove & Discard patch within 12 hours or as directed by MD Patient not taking: Reported on 10/23/2020 04/09/20   Carlean Jews, NP  Oxycodone HCl 10 MG TABS Take 10 mg by mouth every 4 (four) hours as needed (every 4-6 hours PRN.  Pt usually uses 3 times/day). Patient not taking: Reported on 10/23/2020    [provider]    Family History Family History  Problem Relation Age of Onset  . Cancer Mother        thyroid    Social History Social History   Tobacco Use  . Smoking status: Former Smoker    Packs/day: 0.50    Years: 5.00    Pack years: 2.50    Types: Cigarettes  . Smokeless tobacco: Never Used  Vaping Use  . Vaping Use: Never used  Substance Use Topics  . Alcohol use: Yes    Comment:  occasionally beer (holidays, special occasions)  . Drug use: No     Allergies   Cephalexin, Nitrofurantoin, Sulfa antibiotics, and Topiramate   Review of Systems Review of Systems   Physical Exam Triage Vital Signs ED Triage Vitals [11/12/20 1411]  Enc Vitals Group     BP 138/82     Pulse Rate 78     Resp 18     Temp 98.1 F (36.7 C)     Temp src      SpO2 98 %     Weight      Height      Head Circumference      Peak Flow      Pain Score 5     Pain Loc      Pain Edu?      Excl. in GC?    No data found.  Updated Vital Signs BP 138/82   Pulse 78   Temp 98.1 F (36.7 C)   Resp 18   SpO2 98%   Visual Acuity Right Eye Distance:   Left Eye Distance:   Bilateral Distance:    Right Eye Near:   Left Eye Near:    Bilateral Near:     Physical Exam Vitals and nursing note reviewed.  Constitutional:      General: She is not in acute distress.    Appearance: Normal appearance. She is not ill-appearing, toxic-appearing or diaphoretic.  HENT:     Head: Normocephalic.     Nose: Nose normal.     Mouth/Throat:     Pharynx: Oropharynx is clear.  Eyes:     Conjunctiva/sclera: Conjunctivae normal.  Pulmonary:     Effort: Pulmonary effort is normal.  Musculoskeletal:        General: Normal range of motion.     Cervical back: Normal range of motion.  Skin:    General: Skin is warm and dry.     Findings: No rash.  Neurological:     Mental Status: She is alert.  Psychiatric:        Mood and Affect: Mood normal.      UC Treatments / Results  Labs (all labs ordered are listed, but only abnormal results are displayed) Labs Reviewed  CULTURE, GROUP A STREP ([facility])  NOVEL CORONAVIRUS, NAA  POCT RAPID STREP A (OFFICE)    EKG   Radiology No results found.  Procedures Procedures (including  critical care time)  Medications Ordered in UC Medications - No data to display  Initial Impression / Assessment and Plan / UC Course  I have reviewed the triage  vital signs and the nursing notes.  Pertinent labs & imaging results that were available during my care of the patient were reviewed by me and considered in my medical decision making (see chart for details).    Sore throat Rapid strep test negative.  Benign exam.  Covid test pending. Follow up as needed for continued or worsening symptoms  Final Clinical Impressions(s) / UC Diagnoses   Final diagnoses:  Sore throat     Discharge Instructions     Strep test negative Covid test pending  Follow up as needed for continued or worsening symptoms     ED Prescriptions    None     PDMP not reviewed this encounter.   Janace Aris, NP 11/12/20 1452

## 2020-11-14 LAB — NOVEL CORONAVIRUS, NAA: SARS-CoV-2, NAA: NOT DETECTED

## 2020-11-14 LAB — SARS-COV-2, NAA 2 DAY TAT

## 2020-11-15 LAB — CULTURE, GROUP A STREP (THRC)

## 2020-11-24 DIAGNOSIS — Z124 Encounter for screening for malignant neoplasm of cervix: Secondary | ICD-10-CM | POA: Insufficient documentation

## 2021-01-14 ENCOUNTER — Encounter: Payer: Self-pay | Admitting: Nurse Practitioner

## 2021-01-22 ENCOUNTER — Ambulatory Visit: Payer: BC Managed Care – PPO | Admitting: Hospice and Palliative Medicine

## 2021-01-29 ENCOUNTER — Encounter: Payer: Self-pay | Admitting: Nurse Practitioner

## 2021-01-29 ENCOUNTER — Other Ambulatory Visit: Payer: Self-pay

## 2021-01-29 ENCOUNTER — Ambulatory Visit (INDEPENDENT_AMBULATORY_CARE_PROVIDER_SITE_OTHER): Payer: 59 | Admitting: Nurse Practitioner

## 2021-01-29 VITALS — BP 98/63 | HR 91 | Temp 98.8°F | Ht 65.0 in | Wt 144.7 lb

## 2021-01-29 DIAGNOSIS — N958 Other specified menopausal and perimenopausal disorders: Secondary | ICD-10-CM

## 2021-01-29 DIAGNOSIS — L7682 Other postprocedural complications of skin and subcutaneous tissue: Secondary | ICD-10-CM

## 2021-01-29 DIAGNOSIS — Z7689 Persons encountering health services in other specified circumstances: Secondary | ICD-10-CM

## 2021-01-29 DIAGNOSIS — M792 Neuralgia and neuritis, unspecified: Secondary | ICD-10-CM

## 2021-01-29 DIAGNOSIS — R110 Nausea: Secondary | ICD-10-CM

## 2021-01-29 DIAGNOSIS — R319 Hematuria, unspecified: Secondary | ICD-10-CM

## 2021-01-29 LAB — POCT URINALYSIS DIPSTICK
Bilirubin, UA: NEGATIVE
Glucose, UA: NEGATIVE
Ketones, UA: NEGATIVE
Leukocytes, UA: NEGATIVE
Nitrite, UA: NEGATIVE
Protein, UA: NEGATIVE
Spec Grav, UA: 1.015 (ref 1.010–1.025)
Urobilinogen, UA: 0.2 E.U./dL
pH, UA: 5 (ref 5.0–8.0)

## 2021-01-29 MED ORDER — GABAPENTIN 300 MG PO CAPS
300.0000 mg | ORAL_CAPSULE | Freq: Three times a day (TID) | ORAL | 3 refills | Status: DC
Start: 2021-01-29 — End: 2021-05-07

## 2021-01-29 MED ORDER — LIDOCAINE 5 % EX PTCH: 1.0000 | MEDICATED_PATCH | CUTANEOUS | 3 refills | Status: DC

## 2021-01-29 MED ORDER — EST ESTROGENS-METHYLTEST 1.25-2.5 MG PO TABS: 1.0000 | ORAL_TABLET | Freq: Every day | ORAL | 2 refills | Status: DC

## 2021-01-29 MED ORDER — PROMETHAZINE HCL 25 MG PO TABS: 25.0000 mg | ORAL_TABLET | Freq: Three times a day (TID) | ORAL | 2 refills | Status: DC | PRN

## 2021-01-29 NOTE — Progress Notes (Signed)
New Patient Office Visit  Subjective:  Patient ID: Sara Peck, female    DOB: November 20, 1980  Age: 40 y.o. MRN: [id number]  CC:  Chief Complaint  Patient presents with  . New Patient (Initial Visit)    HPI Sara Peck presents to establish new primary care office. She has elected to change primary care offices to stay with current provider for continuity of care. 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 She continues to get frequent UTI. She has standing order for CIPRO 500bid for five days when needed for UTI. She does see nephrology and urology. She has been having right flank pain for the past week or so. This is generally how her UTIs present.  She is due to have fasting blood work. Will have this done prior to or at her next visit. She odes need to have a new prescription for hormone replacement. These were started per GYN provider after she had complete hysterectomy. After she had been stable with this for some time, it was continued per PCP. The hormones she takes does contain testosterone and therefore are controlled. She needs to have new prescription for these today. Her last pap smear was 10/26/2020 and was normal with negative HPV.  Due to the renal basement disorder, patient did have chronic right sided flank pain. She saw pain management provider until she had renal autotransplant procedure. This has eliminated the pain she was having. She now has burning/numbness type pain at surgical site. She is no longer on narcotic pain relievers. She does take gabapentin. She no longer sees pain management. Gabapentin handled per PCP. She has also had lidocaine patches to help. She needs refills for both gabapentin and lidocaine patches.  She denies chest pain, chest pressure, or shortness of breath. She denies headaches or blurry vision.    Past Medical History:  Diagnosis Date  . ADD (attention deficit disorder)   . Chronic kidney disease   . Endometriosis    . Headache    rare migraines.  stress HA more often  . Right flank pain    Sees Dr. Linton Ham at [facility]  . Thin basement membrane disease    sees Lisbeth Ply, MD at [facility]    Past Surgical History:  Procedure Laterality Date  . ABDOMINAL HYSTERECTOMY  12-27-13  . APPENDECTOMY  12-27-13  . COLONOSCOPY  2014   [facility]  . LAPAROSCOPY     multiple  . MOLE REMOVAL    . MYRINGOTOMY WITH TUBE PLACEMENT Bilateral 03/18/2016   Procedure: MYRINGOTOMY WITH TUBE PLACEMENT;  Surgeon: Linus Salmons, MD;  Location: [facility];  Service: ENT;  Laterality: Bilateral;  BUTTERFLY TUBES  . NASAL ENDOSCOPY  2014   [facility]  . PARTIAL HYSTERECTOMY  Jan 2012   with left ovary/tube  . renal autotransplantion    . SKIN LESION EXCISION  2015   back  . TYMPANOSTOMY TUBE PLACEMENT  Jan 2012    Family History  Problem Relation Age of Onset  . Cancer Mother        thyroid  . High blood pressure Mother   . High Cholesterol Father   . Diabetes Paternal Grandfather     Social History   Socioeconomic History  . Marital status: Married    Spouse name: Not on file  . Number of children: Not on file  . Years of education: Not on file  . Highest education level: Not on file  Occupational History  .  Not on file  Tobacco Use  . Smoking status: Former Smoker    Packs/day: 0.50    Years: 5.00    Pack years: 2.50    Types: Cigarettes  . Smokeless tobacco: Never Used  Vaping Use  . Vaping Use: Never used  Substance and Sexual Activity  . Alcohol use: Not Currently    Comment: occasionally beer (holidays, special occasions)  . Drug use: No  . Sexual activity: Yes    Partners: Male  Other Topics Concern  . Not on file  Social History Narrative  . Not on file   Social Determinants of Health   Financial Resource Strain: Not on file  Food Insecurity: Not on file  Transportation Needs: Not on file  Physical Activity: Not on file  Stress: Not on file  Social Connections: Not on file   Intimate Partner Violence: Not on file    ROS Review of Systems  Constitutional: Negative for activity change, chills, fatigue and unexpected weight change.  HENT: Negative for congestion, postnasal drip, rhinorrhea, sneezing and sore throat.   Respiratory: Negative for cough, chest tightness, shortness of breath and wheezing.   Cardiovascular: Negative for chest pain and palpitations.  Gastrointestinal: Negative for abdominal pain, constipation, diarrhea, nausea and vomiting.  Endocrine: Negative.   Genitourinary: Positive for flank pain and hematuria. Negative for dysuria and frequency.  Musculoskeletal: Negative for arthralgias, back pain, joint swelling and neck pain.  Skin: Negative for rash.  Allergic/Immunologic: Negative for environmental allergies.  Neurological: Negative for dizziness, tremors, numbness and headaches.  Hematological: Negative for adenopathy. Does not bruise/bleed easily.  Psychiatric/Behavioral: Negative for behavioral problems (Depression), sleep disturbance and suicidal ideas. The patient is nervous/anxious.        Intermittent anxiety     Objective:   Today's Vitals   01/29/21 0853  BP: 98/63  Pulse: 91  Temp: 98.8 F (37.1 C)  SpO2: 98%  Weight: 144 lb 11.2 oz (65.6 kg)  Height: 5\' 5"  (1.651 m)   Body mass index is 24.08 kg/m.   Physical Exam Vitals and nursing note reviewed.  Constitutional:      Appearance: Normal appearance. She is well-developed.  HENT:     Head: Normocephalic and atraumatic.     Nose: Nose normal.  Eyes:     Pupils: Pupils are equal, round, and reactive to light.  Cardiovascular:     Rate and Rhythm: Normal rate and regular rhythm.     Pulses: Normal pulses.     Heart sounds: Normal heart sounds.  Pulmonary:     Effort: Pulmonary effort is normal.     Breath sounds: Normal breath sounds.  Abdominal:     General: Bowel sounds are normal.     Palpations: Abdomen is soft.     Tenderness: There is no abdominal  tenderness.  Genitourinary:    Comments: Urine sample positive for moderate blood today Musculoskeletal:        General: Normal range of motion.     Cervical back: Normal range of motion and neck supple.  Skin:    General: Skin is warm and dry.     Capillary Refill: Capillary refill takes less than 2 seconds.  Neurological:     General: No focal deficit present.     Mental Status: She is alert and oriented to person, place, and time.  Psychiatric:        Mood and Affect: Mood normal.        Behavior: Behavior normal.  Thought Content: Thought content normal.        Judgment: Judgment normal.     Assessment & Plan:  1. Encounter to establish care Appointment today to establish new primary care office   2. Hematuria, unspecified type U/a positive for moderate blood today. Encouraged her to start cipro 500mg  twice daily for 5 days. Will send urine sample for culture and sensitivity and adjust antibiotic treatment as indicated.  - POCT Urinalysis Dipstick - Urine Culture  3. Artificial menopause Continue estrotet as prescribed. Refills provided today.  - estrogens-methylTEST (ESTRATEST) 1.25-2.5 MG tablet; Take 1 tablet by mouth daily.  Dispense: 30 tablet; Refill: 2  4. Incisional pain May take gabapentin 300mg  up to three times daily if needed. Patient gradually weaning down dosing. Apply lidocaine patches to effected area daily as prescribed  - gabapentin (NEURONTIN) 300 MG capsule; Take 1 capsule (300 mg total) by mouth 3 (three) times daily.  Dispense: 90 capsule; Refill: 3 - lidocaine (LIDODERM) 5 %; Place 1 patch onto the skin daily. Remove & Discard patch within 12 hours or as directed by MD  Dispense: 30 patch; Refill: 3  5. Neuropathic pain May take gabapentin 300mg  up to three times daily if needed. Patient gradually weaning down dosing. - gabapentin (NEURONTIN) 300 MG capsule; Take 1 capsule (300 mg total) by mouth 3 (three) times daily.  Dispense: 90 capsule;  Refill: 3  6. Nausea Nausea associated with taking oral antibiotic. May take promethazine as needed for nausea. New prescription provided today. - promethazine (PHENERGAN) 25 MG tablet; Take 1 tablet (25 mg total) by mouth every 8 (eight) hours as needed for nausea or vomiting.  Dispense: 30 tablet; Refill: 2  Problem List Items Addressed This Visit      Other   Artificial menopause   Relevant Medications   estrogens-methylTEST (ESTRATEST) 1.25-2.5 MG tablet   Incisional pain   Relevant Medications   gabapentin (NEURONTIN) 300 MG capsule   lidocaine (LIDODERM) 5 %   Neuropathic pain   Relevant Medications   gabapentin (NEURONTIN) 300 MG capsule   Encounter to establish care - Primary   Hematuria   Relevant Orders   POCT Urinalysis Dipstick (Completed)   Urine Culture (Completed)   Nausea   Relevant Medications   promethazine (PHENERGAN) 25 MG tablet      Outpatient Encounter Medications as of 01/29/2021  Medication Sig  . clorazepate (TRANXENE) 7.5 MG tablet TAKE 1 TABLET BY MOUTH TWICE A DAY AS NEEDED FOR ANXIETY  . promethazine (PHENERGAN) 25 MG tablet Take 1 tablet (25 mg total) by mouth every 8 (eight) hours as needed for nausea or vomiting.  . [DISCONTINUED] estrogens-methylTEST (ESTRATEST) 1.25-2.5 MG tablet Take 1 tablet by mouth daily.  . [DISCONTINUED] gabapentin (NEURONTIN) 300 MG capsule Take 1 capsule (300 mg total) by mouth 3 (three) times daily.  . [DISCONTINUED] lidocaine (LIDODERM) 5 % Place 1 patch onto the skin daily. Remove & Discard patch within 12 hours or as directed by MD  . ciprofloxacin (CIPRO) 500 MG tablet Take 500 mg by mouth 2 (two) times daily.  estrogens-methylTEST (ESTRATEST) 1.25-2.5 MG tablet Take 1 tablet by mouth daily.  gabapentin (NEURONTIN) 300 MG capsule Take 1 capsule (300 mg total) by mouth 3 (three) times daily.  01/31/2021 lidocaine (LIDODERM) 5 % Place 1 patch onto the skin daily. Remove & Discard patch within 12 hours or as directed by  MD  . [DISCONTINUED] Oxycodone HCl 10 MG TABS Take 10 mg by mouth every  4 (four) hours as needed (every 4-6 hours PRN.  Pt usually uses 3 times/day). (Patient not taking: Reported on 10/23/2020)   No facility-administered encounter medications on file as of 01/29/2021.   Time spent with the patient was approximately 45 minutes. This time included reviewing progress notes, labs, imaging studies, and discussing plan for follow up.   Follow-up: Return in about 3 months (around 05/01/2021) for fasting blood work about a week before .   Carlean Jews, NP

## 2021-01-29 NOTE — Patient Instructions (Signed)

## 2021-02-01 LAB — SPECIMEN STATUS REPORT

## 2021-02-01 LAB — URINE CULTURE

## 2021-02-01 NOTE — Progress Notes (Signed)
Mixed flora. Treated with standing order for cipro BID for 7 days. Will follow up with nephrology next month.

## 2021-02-03 DIAGNOSIS — R319 Hematuria, unspecified: Secondary | ICD-10-CM | POA: Insufficient documentation

## 2021-02-03 DIAGNOSIS — R110 Nausea: Secondary | ICD-10-CM | POA: Insufficient documentation

## 2021-02-03 DIAGNOSIS — Z7689 Persons encountering health services in other specified circumstances: Secondary | ICD-10-CM | POA: Insufficient documentation

## 2021-04-20 ENCOUNTER — Other Ambulatory Visit: Payer: Self-pay | Admitting: Nurse Practitioner

## 2021-04-20 DIAGNOSIS — Z0000 Encounter for general adult medical examination without abnormal findings: Secondary | ICD-10-CM

## 2021-04-23 ENCOUNTER — Other Ambulatory Visit: Payer: Self-pay

## 2021-04-23 ENCOUNTER — Other Ambulatory Visit: Payer: 59

## 2021-04-23 DIAGNOSIS — Z0000 Encounter for general adult medical examination without abnormal findings: Secondary | ICD-10-CM

## 2021-04-24 LAB — COMPREHENSIVE METABOLIC PANEL
ALT: 11 IU/L (ref 0–32)
AST: 14 IU/L (ref 0–40)
Albumin/Globulin Ratio: 2.1 (ref 1.2–2.2)
Albumin: 4.8 g/dL (ref 3.8–4.8)
Alkaline Phosphatase: 100 IU/L (ref 44–121)
BUN/Creatinine Ratio: 14 (ref 9–23)
BUN: 13 mg/dL (ref 6–20)
Bilirubin Total: 1.1 mg/dL (ref 0.0–1.2)
CO2: 22 mmol/L (ref 20–29)
Calcium: 9.3 mg/dL (ref 8.7–10.2)
Chloride: 105 mmol/L (ref 96–106)
Creatinine, Ser: 0.92 mg/dL (ref 0.57–1.00)
Globulin, Total: 2.3 g/dL (ref 1.5–4.5)
Glucose: 82 mg/dL (ref 65–99)
Potassium: 4.6 mmol/L (ref 3.5–5.2)
Sodium: 142 mmol/L (ref 134–144)
Total Protein: 7.1 g/dL (ref 6.0–8.5)
eGFR: 82 mL/min/{1.73_m2} (ref >59)

## 2021-04-24 LAB — CBC
Hematocrit: 44.1 % (ref 34.0–46.6)
Hemoglobin: 14.8 g/dL (ref 11.1–15.9)
MCH: 31.6 pg (ref 26.6–33.0)
MCHC: 33.6 g/dL (ref 31.5–35.7)
MCV: 94 fL (ref 79–97)
Platelets: 331 10*3/uL (ref 150–450)
RBC: 4.68 x10E6/uL (ref 3.77–5.28)
RDW: 12.7 % (ref 11.7–15.4)
WBC: 7.9 10*3/uL (ref 3.4–10.8)

## 2021-04-24 LAB — LIPID PANEL
Chol/HDL Ratio: 3.5 ratio (ref 0.0–4.4)
Cholesterol, Total: 149 mg/dL (ref 100–199)
HDL: 42 mg/dL (ref >39)
LDL Chol Calc (NIH): 92 mg/dL (ref 0–99)
Triglycerides: 80 mg/dL (ref 0–149)
VLDL Cholesterol Cal: 15 mg/dL (ref 5–40)

## 2021-04-24 LAB — HEMOGLOBIN A1C
Est. average glucose Bld gHb Est-mCnc: 111 mg/dL
Hgb A1c MFr Bld: 5.5 % (ref 4.8–5.6)

## 2021-04-24 LAB — TSH: TSH: 1.2 u[IU]/mL (ref 0.450–4.500)

## 2021-04-25 ENCOUNTER — Encounter: Payer: Self-pay | Admitting: Nurse Practitioner

## 2021-04-25 NOTE — Progress Notes (Signed)
Labs good. Message sent via mychart.

## 2021-04-30 ENCOUNTER — Ambulatory Visit: Payer: 59 | Admitting: Nurse Practitioner

## 2021-05-07 ENCOUNTER — Encounter: Payer: Self-pay | Admitting: Nurse Practitioner

## 2021-05-07 ENCOUNTER — Other Ambulatory Visit: Payer: Self-pay

## 2021-05-07 ENCOUNTER — Ambulatory Visit: Payer: 59 | Admitting: Nurse Practitioner

## 2021-05-07 VITALS — BP 106/73 | HR 83 | Temp 98.7°F | Ht 65.0 in | Wt 142.5 lb

## 2021-05-07 DIAGNOSIS — F411 Generalized anxiety disorder: Secondary | ICD-10-CM | POA: Diagnosis not present

## 2021-05-07 DIAGNOSIS — M792 Neuralgia and neuritis, unspecified: Secondary | ICD-10-CM | POA: Diagnosis not present

## 2021-05-07 DIAGNOSIS — R601 Generalized edema: Secondary | ICD-10-CM | POA: Diagnosis not present

## 2021-05-07 DIAGNOSIS — N958 Other specified menopausal and perimenopausal disorders: Secondary | ICD-10-CM

## 2021-05-07 DIAGNOSIS — R41840 Attention and concentration deficit: Secondary | ICD-10-CM

## 2021-05-07 MED ORDER — HYDROCHLOROTHIAZIDE 12.5 MG PO TABS: ORAL_TABLET | ORAL | 1 refills | Status: DC

## 2021-05-07 MED ORDER — CLORAZEPATE DIPOTASSIUM 7.5 MG PO TABS: ORAL_TABLET | ORAL | 2 refills | Status: DC

## 2021-05-07 MED ORDER — GABAPENTIN 300 MG PO CAPS: 300.0000 mg | ORAL_CAPSULE | Freq: Three times a day (TID) | ORAL | 1 refills | Status: DC

## 2021-05-07 MED ORDER — EST ESTROGENS-METHYLTEST 1.25-2.5 MG PO TABS: 1.0000 | ORAL_TABLET | Freq: Every day | ORAL | 1 refills | Status: DC

## 2021-05-07 NOTE — Progress Notes (Signed)
Established Patient Office Visit  Subjective:  Patient ID: Sara Peck, female    DOB: 02/05/1981  Age: 40 y.o. MRN: [id number]  CC:  Chief Complaint  Patient presents with   Follow-up    HPI Sara Peck presents for routine follow-up visit.  She continues to have nerve type pain in the right flank area after having surgery.  Currently takes gabapentin 3 times daily which does help.  She does need to have refills for this today.  She also needs to have a refill of her hormone replacement medication.  She states she does feel like she is retaining fluid sometimes.  She does have history of kidney disease for which she had an auto transplant.  Blood pressures within normal limits.  Reviewed her PDMP profile today.  Her overdose was score is 220.  Her fill history is appropriate.  She has not been on narcotic pain medications for some time. She had routine fasting labs prior to this visit.  All labs were within normal limits. She states that she has had some difficulty with concentration and focus.  Finishing tasks has become especially hard in recent months.  Past Medical History:  Diagnosis Date   ADD (attention deficit disorder)    Chronic kidney disease    Endometriosis    Headache    rare migraines.  stress HA more often   Right flank pain    Sees Dr[facility] Weyman Peck at [facility]   Thin basement membrane disease    sees Sara Cruz, MD at [facility]    Past Surgical History:  Procedure Laterality Date   ABDOMINAL HYSTERECTOMY  12-27-13   APPENDECTOMY  12-27-13   COLONOSCOPY  2014   [facility]   LAPAROSCOPY     multiple   MOLE REMOVAL     MYRINGOTOMY WITH TUBE PLACEMENT Bilateral 03/18/2016   Procedure: MYRINGOTOMY WITH TUBE PLACEMENT;  Surgeon: Sara Gust, MD;  Location: [place];  Service: ENT;  Laterality: Bilateral;  [place]   NASAL ENDOSCOPY  2014   [facility]   PARTIAL HYSTERECTOMY  Jan 2012   with left ovary/tube   renal autotransplantion     SKIN LESION  EXCISION  2015   back   TYMPANOSTOMY TUBE PLACEMENT  Jan 2012    Family History  Problem Relation Age of Onset   Cancer Mother        thyroid   High blood pressure Mother    High Cholesterol Father    Diabetes Paternal Grandfather     Social History   Socioeconomic History   Marital status: Married    Spouse name: Not on file   Number of children: Not on file   Years of education: Not on file   Highest education level: Not on file  Occupational History   Not on file  Tobacco Use   Smoking status: Former    Packs/day: 0.50    Years: 5.00    Pack years: 2.50    Types: Cigarettes   Smokeless tobacco: Never  Vaping Use   Vaping Use: Never used  Substance and Sexual Activity   Alcohol use: Not Currently    Comment: occasionally beer (holidays, special occasions)   Drug use: No   Sexual activity: Yes    Partners: Male  Other Topics Concern   Not on file  Social History Narrative   Not on file   Social Determinants of Health   Financial Resource Strain: Not on file  Food Insecurity:  Not on file  Transportation Needs: Not on file  Physical Activity: Not on file  Stress: Not on file  Social Connections: Not on file  Intimate Partner Violence: Not on file    Outpatient Medications Prior to Visit  Medication Sig Dispense Refill   ciprofloxacin (CIPRO) 500 MG tablet Take 500 mg by mouth 2 (two) times daily.     lidocaine (LIDODERM) 5 % Place 1 patch onto the skin daily. Remove & Discard patch within 12 hours or as directed by MD 30 patch 3   promethazine (PHENERGAN) 25 MG tablet Take 1 tablet (25 mg total) by mouth every 8 (eight) hours as needed for nausea or vomiting. 30 tablet 2   clorazepate (TRANXENE) 7.5 MG tablet TAKE 1 TABLET BY MOUTH TWICE A DAY AS NEEDED FOR ANXIETY 60 tablet 2   estrogens-methylTEST (ESTRATEST) 1.25-2.5 MG tablet Take 1 tablet by mouth daily. 30 tablet 2   gabapentin (NEURONTIN) 300 MG capsule Take 1 capsule (300 mg total) by mouth 3  (three) times daily. 90 capsule 3   No facility-administered medications prior to visit.    Allergies  Allergen Reactions   Cephalexin Hives    Other Reaction: Other reaction    Nitrofurantoin Hives   Other Hives and Itching   Sulfa Antibiotics Hives   Topiramate Hives    ROS Review of Systems  Constitutional:  Negative for activity change, chills, fatigue and unexpected weight change.  HENT:  Negative for congestion, postnasal drip, rhinorrhea, sneezing and sore throat.   Eyes: Negative.   Respiratory:  Negative for cough, chest tightness, shortness of breath and wheezing.   Cardiovascular:  Negative for chest pain and palpitations.       Feels like she is retaining fluid.  Gastrointestinal:  Negative for abdominal pain, constipation, diarrhea, nausea and vomiting.  Endocrine: Negative.   Genitourinary:  Negative for dysuria, flank pain, frequency and hematuria.       Right flank pain and skin and subcutaneous layers and skin.  Likely from surgical scarring.  Musculoskeletal:  Negative for arthralgias, back pain, joint swelling and neck pain.  Skin:  Negative for rash.  Allergic/Immunologic: Negative for environmental allergies.  Neurological:  Negative for dizziness, tremors, numbness and headaches.  Hematological:  Negative for adenopathy. Does not bruise/bleed easily.  Psychiatric/Behavioral:  Positive for decreased concentration. Negative for behavioral problems (Depression), sleep disturbance and suicidal ideas. The patient is nervous/anxious.      Objective:    Physical Exam Vitals and nursing note reviewed.  Constitutional:      Appearance: Normal appearance. She is well-developed.  HENT:     Head: Normocephalic and atraumatic.     Nose: Nose normal.     Mouth/Throat:     Mouth: Mucous membranes are moist.  Eyes:     Extraocular Movements: Extraocular movements intact.     Conjunctiva/sclera: Conjunctivae normal.     Pupils: Pupils are equal, round, and  reactive to light.  Cardiovascular:     Rate and Rhythm: Normal rate and regular rhythm.     Pulses: Normal pulses.     Heart sounds: Normal heart sounds.  Pulmonary:     Effort: Pulmonary effort is normal.     Breath sounds: Normal breath sounds.  Abdominal:     Palpations: Abdomen is soft.  Musculoskeletal:        General: Normal range of motion.     Cervical back: Normal range of motion and neck supple.  Lymphadenopathy:     Cervical:  No cervical adenopathy.  Skin:    General: Skin is warm and dry.     Capillary Refill: Capillary refill takes less than 2 seconds.  Neurological:     General: No focal deficit present.     Mental Status: She is alert and oriented to person, place, and time.  Psychiatric:        Mood and Affect: Mood normal.        Behavior: Behavior normal.        Thought Content: Thought content normal.        Judgment: Judgment normal.    Today's Vitals   05/07/21 1019  BP: 106/73  Pulse: 83  Temp: 98.7 F (37.1 C)  SpO2: 99%  Weight: 142 lb 8 oz (64.6 kg)  Height: '5\' 5"'  (1.651 m)   Body mass index is 23.71 kg/m.   Wt Readings from Last 3 Encounters:  05/07/21 142 lb 8 oz (64.6 kg)  01/29/21 144 lb 11.2 oz (65.6 kg)  10/23/20 141 lb 6.4 oz (64.1 kg)     Health Maintenance Due  Topic Date Due   Pneumococcal Vaccine 66-21 Years old (1 - PCV) Never done   HIV Screening  Never done   TETANUS/TDAP  Never done   COVID-19 Vaccine (3 - Moderna risk series) 08/25/2020    There are no preventive care reminders to display for this patient.  Lab Results  Component Value Date   TSH 1.200 04/23/2021   Lab Results  Component Value Date   WBC 7.9 04/23/2021   HGB 14.8 04/23/2021   HCT 44.1 04/23/2021   MCV 94 04/23/2021   PLT 331 04/23/2021   Lab Results  Component Value Date   NA 142 04/23/2021   K 4.6 04/23/2021   CO2 22 04/23/2021   GLUCOSE 82 04/23/2021   BUN 13 04/23/2021   CREATININE 0.92 04/23/2021   BILITOT 1.1 04/23/2021    ALKPHOS 100 04/23/2021   AST 14 04/23/2021   ALT 11 04/23/2021   PROT 7.1 04/23/2021   ALBUMIN 4.8 04/23/2021   CALCIUM 9.3 04/23/2021   ANIONGAP 8 08/14/2015   EGFR 82 04/23/2021   Lab Results  Component Value Date   CHOL 149 04/23/2021   Lab Results  Component Value Date   HDL 42 04/23/2021   Lab Results  Component Value Date   LDLCALC 92 04/23/2021   Lab Results  Component Value Date   TRIG 80 04/23/2021   Lab Results  Component Value Date   CHOLHDL 3.5 04/23/2021   Lab Results  Component Value Date   HGBA1C 5.5 04/23/2021      Assessment & Plan:  1. Generalized edema Trial of hydrochlorothiazide 12.5 mg tablets daily.  Take 1/2 to 1 tablet as needed for edema.  Notify office of negative effects. - hydrochlorothiazide (HYDRODIURIL) 12.5 MG tablet; Take 1/2 to 1 tablet po QD prn edema  Dispense: 90 tablet; Refill: 1  2. Artificial menopause Continue with Estratest daily.  Refill sent to her pharmacy today. - estrogens-methylTEST (ESTRATEST) 1.25-2.5 MG tablet; Take 1 tablet by mouth daily.  Dispense: 90 tablet; Refill: 1  3. Neuropathic pain Had flank incision.  Well managed with gabapentin.  Continue 300 mg up to 3 times daily as needed.  Refills sent to her pharmacy today. - gabapentin (NEURONTIN) 300 MG capsule; Take 1 capsule (300 mg total) by mouth 3 (three) times daily.  Dispense: 270 capsule; Refill: 1  4. GAD (generalized anxiety disorder) Prior prescription for clorazepate has expired.  She may take 7.5 mg tablets by mouth up to twice daily as needed for acute anxiety.  New prescription was sent to her pharmacy today. - clorazepate (TRANXENE) 7.5 MG tablet; TAKE 1 TABLET BY MOUTH TWICE A DAY AS NEEDED FOR ANXIETY  Dispense: 60 tablet; Refill: 2  5. Concentration deficit Patient will need to schedule appointment for evaluation of concentration and attention deficit.  Problem List Items Addressed This Visit       Other   Artificial menopause    Relevant Medications   estrogens-methylTEST (ESTRATEST) 1.25-2.5 MG tablet   GAD (generalized anxiety disorder)   Relevant Medications   clorazepate (TRANXENE) 7.5 MG tablet   Neuropathic pain   Relevant Medications   gabapentin (NEURONTIN) 300 MG capsule   Generalized edema - Primary   Relevant Medications   hydrochlorothiazide (HYDRODIURIL) 12.5 MG tablet   Concentration deficit    Meds ordered this encounter  Medications   hydrochlorothiazide (HYDRODIURIL) 12.5 MG tablet    Sig: Take 1/2 to 1 tablet po QD prn edema    Dispense:  90 tablet    Refill:  1    Order Specific Question:   Supervising Provider    Answer:   Beatrice Lecher D [[id number]]   estrogens-methylTEST (ESTRATEST) 1.25-2.5 MG tablet    Sig: Take 1 tablet by mouth daily.    Dispense:  90 tablet    Refill:  1    Order Specific Question:   Supervising Provider    Answer:   Beatrice Lecher D [[id number]]   gabapentin (NEURONTIN) 300 MG capsule    Sig: Take 1 capsule (300 mg total) by mouth 3 (three) times daily.    Dispense:  270 capsule    Refill:  1    Written prescription given to patient today.    Order Specific Question:   Supervising Provider    Answer:   Beatrice Lecher D [[id number]]   clorazepate (TRANXENE) 7.5 MG tablet    Sig: TAKE 1 TABLET BY MOUTH TWICE A DAY AS NEEDED FOR ANXIETY    Dispense:  60 tablet    Refill:  2    New prescription as prior refills have expired    Order Specific Question:   Supervising Provider    Answer:   Beatrice Lecher D [[id number]]    Follow-up: Return in about 5 months (around 10/07/2021) for health maintenance exam - no pap needed this year. will need clinical breast exam.    Ronnell Freshwater, NP

## 2021-05-10 DIAGNOSIS — R41840 Attention and concentration deficit: Secondary | ICD-10-CM | POA: Insufficient documentation

## 2021-05-10 DIAGNOSIS — R601 Generalized edema: Secondary | ICD-10-CM | POA: Insufficient documentation

## 2021-06-02 ENCOUNTER — Encounter: Payer: Self-pay | Admitting: Nurse Practitioner

## 2021-06-09 ENCOUNTER — Telehealth: Payer: Self-pay | Admitting: Nurse Practitioner

## 2021-06-09 NOTE — Telephone Encounter (Signed)
Patient needs a new prescription on Estratest sent [facility] in [place]. Pharmacist DEA number is expired for [facility]. Please advise, thanks.

## 2021-06-10 ENCOUNTER — Encounter: Payer: Self-pay | Admitting: Nurse Practitioner

## 2021-06-10 NOTE — Telephone Encounter (Signed)
Called pt she is advised that her Rx is ready at the pharmacy

## 2021-09-16 ENCOUNTER — Ambulatory Visit
Admission: EM | Admit: 2021-09-16 | Discharge: 2021-09-16 | Disposition: A | Discharge status: Home / Self Care | Payer: 59 | Attending: Emergency Medicine | Admitting: Emergency Medicine

## 2021-09-16 ENCOUNTER — Encounter: Payer: Self-pay | Admitting: Emergency Medicine

## 2021-09-16 ENCOUNTER — Other Ambulatory Visit: Payer: Self-pay

## 2021-09-16 DIAGNOSIS — J0100 Acute maxillary sinusitis, unspecified: Secondary | ICD-10-CM | POA: Insufficient documentation

## 2021-09-16 LAB — RAPID INFLUENZA A&B ANTIGENS
Influenza A (ARMC): NEGATIVE
Influenza B (ARMC): NEGATIVE

## 2021-09-16 MED ORDER — AMOXICILLIN-POT CLAVULANATE 875-125 MG PO TABS: 1.0000 | ORAL_TABLET | Freq: Two times a day (BID) | ORAL | 0 refills | Status: DC

## 2021-09-16 MED ORDER — FLUTICASONE PROPIONATE 50 MCG/ACT NA SUSP: 1.0000 | Freq: Every day | NASAL | 0 refills | Status: DC

## 2021-09-16 MED ORDER — CETIRIZINE HCL 10 MG PO TABS: 10.0000 mg | ORAL_TABLET | Freq: Every day | ORAL | 0 refills | Status: DC

## 2021-09-16 NOTE — ED Triage Notes (Signed)
Pt presents today with c/o nasal congestion, sinus pressure, cough and low grade fever x 4 days. No meds pta.

## 2021-09-16 NOTE — Discharge Instructions (Signed)
I believe you to have a sinus infection, these are typically caused by viruses and we must wait for least 10 days before attempting to trial an antibiotic to resolve symptoms  Continue use of the Mucinex twice a day to help thin secretions  In addition you may begin use of Flonase nasal spray to help clear sinus passages every morning  You may also take Zyrtec before bedtime and attempt to decrease the amount of mucus present  On day 10 of illness you may begin use of antibiotic, it will be available for you at the pharmacy on Monday, take Augmentin twice a day for the next 7 days  May follow-up with urgent care as needed

## 2021-09-16 NOTE — ED Provider Notes (Signed)
[facility]    CSN: [id number] Arrival date & time: 09/16/21  1035      History   Chief Complaint Chief Complaint  Patient presents with   Nasal Congestion   Facial Pain   Fever   Cough    HPI JENNILYN ESTEVE is a 40 y.o. female.   Patient presents with nasal congestion, facial pain and pressure, intermittent generalized headaches, bilateral ear pain and fullness, low-grade fever, nonproductive cough for 4 days.  Known sick contact.  Has attempted use of extra strength Mucinex with no relief.  Tolerating food and liquids.  Denies chills, body aches, shortness of breath, wheezing, abdominal pain, nausea, vomiting, diarrhea, sore throat.  Past Medical History:  Diagnosis Date   ADD (attention deficit disorder)    Chronic kidney disease    Endometriosis    Headache    rare migraines.  stress HA more often   Right flank pain    Sees Dr. Linton Ham at [facility]   Thin basement membrane disease    sees Lisbeth Ply, MD at [facility]    Patient Active Problem List   Diagnosis Date Noted   Generalized edema 05/10/2021   Concentration deficit 05/10/2021   Encounter to establish care 02/03/2021   Hematuria 02/03/2021   Nausea 02/03/2021   Routine cervical smear 11/24/2020   Incisional pain 04/12/2020   Neuropathic pain 04/12/2020   Personal history of other malignant neoplasm of skin 10/23/2018   Urinary tract infection with hematuria 07/22/2018   Dysuria 07/22/2018   Pain syndrome, chronic 02/16/2018   GAD (generalized anxiety disorder) 12/15/2017   Thin basement membrane nephropathy 04/12/2017   Migraine without aura and without status migrainosus, not intractable 09/15/2016   Endometriosis 01/19/2015   Adult attention deficit disorder 12/24/2014   Hematoma, postoperative 09/20/2014   Microscopic hematuria 09/20/2014   Abdominal pain, unspecified site 03/28/2014   Artificial menopause 01/08/2014   Encounter for general adult medical examination with abnormal  findings 12/03/2012    Past Surgical History:  Procedure Laterality Date   ABDOMINAL HYSTERECTOMY  12-27-13   APPENDECTOMY  12-27-13   COLONOSCOPY  2014   [facility]   LAPAROSCOPY     multiple   MOLE REMOVAL     MYRINGOTOMY WITH TUBE PLACEMENT Bilateral 03/18/2016   Procedure: MYRINGOTOMY WITH TUBE PLACEMENT;  Surgeon: Linus Salmons, MD;  Location: [facility];  Service: ENT;  Laterality: Bilateral;  BUTTERFLY TUBES   NASAL ENDOSCOPY  2014   [facility]   PARTIAL HYSTERECTOMY  Jan 2012   with left ovary/tube   renal autotransplantion     SKIN LESION EXCISION  2015   back   TYMPANOSTOMY TUBE PLACEMENT  Jan 2012    OB History     Gravida  2   Para  2   Term      Preterm      AB      Living         SAB      IAB      Ectopic      Multiple      Live Births           Obstetric Comments  1st Menstrual Cycle:  15 1st Pregnancy:  59 Menopause age 72          Home Medications    Prior to Admission medications   Medication Sig Start Date End Date Taking? Authorizing Provider  ciprofloxacin (CIPRO) 500 MG tablet Take 500 mg by mouth  2 (two) times daily.    [provider]  clorazepate (TRANXENE) 7.5 MG tablet TAKE 1 TABLET BY MOUTH TWICE A DAY AS NEEDED FOR ANXIETY 05/07/21   Boscia, Greer Ee, NP  estrogens-methylTEST (ESTRATEST) 1.25-2.5 MG tablet Take 1 tablet by mouth daily. 05/07/21   Ronnell Freshwater, NP  gabapentin (NEURONTIN) 300 MG capsule Take 1 capsule (300 mg total) by mouth 3 (three) times daily. 05/07/21   Ronnell Freshwater, NP  hydrochlorothiazide (HYDRODIURIL) 12.5 MG tablet Take 1/2 to 1 tablet po QD prn edema 05/07/21   Ronnell Freshwater, NP  lidocaine (LIDODERM) 5 % Place 1 patch onto the skin daily. Remove & Discard patch within 12 hours or as directed by MD 01/29/21   Ronnell Freshwater, NP  promethazine (PHENERGAN) 25 MG tablet Take 1 tablet (25 mg total) by mouth every 8 (eight) hours as needed for nausea or vomiting. 01/29/21    Ronnell Freshwater, NP    Family History Family History  Problem Relation Age of Onset   Cancer Mother        thyroid   High blood pressure Mother    High Cholesterol Father    Diabetes Paternal Grandfather     Social History Social History   Tobacco Use   Smoking status: Former    Packs/day: 0.50    Years: 5.00    Pack years: 2.50    Types: Cigarettes   Smokeless tobacco: Never  Vaping Use   Vaping Use: Never used  Substance Use Topics   Alcohol use: Not Currently    Comment: occasionally beer (holidays, special occasions)   Drug use: No     Allergies   Cephalexin, Nitrofurantoin, Other, Sulfa antibiotics, and Topiramate   Review of Systems Review of Systems  Constitutional:  Positive for fever. Negative for activity change, appetite change, chills, diaphoresis, fatigue and unexpected weight change.  HENT:  Positive for congestion, ear pain, facial swelling and rhinorrhea. Negative for dental problem, drooling, ear discharge, hearing loss, mouth sores, nosebleeds, postnasal drip, sinus pressure, sinus pain, sneezing, sore throat, tinnitus, trouble swallowing and voice change.   Respiratory:  Positive for cough. Negative for apnea, choking, chest tightness, shortness of breath, wheezing and stridor.   Cardiovascular: Negative.   Gastrointestinal: Negative.   Skin: Negative.   Neurological:  Positive for headaches. Negative for dizziness, tremors, seizures, syncope, facial asymmetry, speech difficulty, weakness, light-headedness and numbness.    Physical Exam Triage Vital Signs ED Triage Vitals  Enc Vitals Group     BP 09/16/21 1103 135/82     Pulse Rate 09/16/21 1103 84     Resp 09/16/21 1103 18     Temp 09/16/21 1103 98.2 F (36.8 C)     Temp Source 09/16/21 1103 Oral     SpO2 09/16/21 1103 100 %     Weight --      Height --      Head Circumference --      Peak Flow --      Pain Score 09/16/21 1102 0     Pain Loc --      Pain Edu? --      Excl. in [place]?  --    No data found.  Updated Vital Signs BP 135/82 (BP Location: Right Arm)   Pulse 84   Temp 98.2 F (36.8 C) (Oral)   Resp 18   SpO2 100%   Visual Acuity Right Eye Distance:   Left Eye Distance:   Bilateral Distance:  Right Eye Near:   Left Eye Near:    Bilateral Near:     Physical Exam Constitutional:      Appearance: Normal appearance. She is normal weight.  HENT:     Head: Normocephalic.     Right Ear: Tympanic membrane, ear canal and external ear normal.     Left Ear: Tympanic membrane, ear canal and external ear normal.     Ears:     Comments: Bilateral tubes in place    Nose: Congestion and rhinorrhea present.     Right Turbinates: Enlarged.     Left Turbinates: Enlarged.     Right Sinus: Maxillary sinus tenderness present.     Left Sinus: Maxillary sinus tenderness present.     Mouth/Throat:     Mouth: Mucous membranes are moist.     Pharynx: Oropharynx is clear.  Eyes:     Extraocular Movements: Extraocular movements intact.  Cardiovascular:     Rate and Rhythm: Normal rate and regular rhythm.     Pulses: Normal pulses.     Heart sounds: Normal heart sounds.  Pulmonary:     Effort: Pulmonary effort is normal.     Breath sounds: Normal breath sounds.  Musculoskeletal:     Cervical back: Normal range of motion and neck supple.  Skin:    General: Skin is warm and dry.  Neurological:     Mental Status: She is alert and oriented to person, place, and time. Mental status is at baseline.  Psychiatric:        Mood and Affect: Mood normal.        Behavior: Behavior normal.     UC Treatments / Results  Labs (all labs ordered are listed, but only abnormal results are displayed) Labs Reviewed  RAPID INFLUENZA A&B ANTIGENS    EKG   Radiology No results found.  Procedures Procedures (including critical care time)  Medications Ordered in UC Medications - No data to display  Initial Impression / Assessment and Plan / UC Course  I have  reviewed the triage vital signs and the nursing notes.  Pertinent labs & imaging results that were available during my care of the patient were reviewed by me and considered in my medical decision making (see chart for details).  Acute maxillary sinusitis  Discussed etiology of symptoms, timeline and possible resolution, will prescribe watchful wait antibiotic for day 10 of illness, patient in agreement with plan of care  1.  Flonase 50 mcg 1 spray each nostril daily 2.  Zyrtec 10 mg daily at bedtime 3.  Recommended continued use of Mucinex 4.  Augmentin 875-125 twice daily for 7 days to begin use on day 10 of illness, patient can get medicine from pharmacy on day 9 5.  Urgent Care follow-up as needed 6.  Flu test pending  Final Clinical Impressions(s) / UC Diagnoses   Final diagnoses:  None   Discharge Instructions   None    ED Prescriptions   None    PDMP not reviewed this encounter.   Hans Eden, NP 09/16/21 1154

## 2021-09-17 ENCOUNTER — Ambulatory Visit: Payer: 59

## 2021-10-07 ENCOUNTER — Encounter: Payer: Self-pay | Admitting: Nurse Practitioner

## 2021-10-07 ENCOUNTER — Other Ambulatory Visit: Payer: Self-pay

## 2021-10-07 ENCOUNTER — Ambulatory Visit (INDEPENDENT_AMBULATORY_CARE_PROVIDER_SITE_OTHER): Payer: 59 | Admitting: Nurse Practitioner

## 2021-10-07 VITALS — BP 113/72 | HR 78 | Temp 98.1°F | Ht 65.0 in | Wt 144.3 lb

## 2021-10-07 DIAGNOSIS — F411 Generalized anxiety disorder: Secondary | ICD-10-CM | POA: Diagnosis not present

## 2021-10-07 DIAGNOSIS — N958 Other specified menopausal and perimenopausal disorders: Secondary | ICD-10-CM

## 2021-10-07 DIAGNOSIS — M792 Neuralgia and neuritis, unspecified: Secondary | ICD-10-CM

## 2021-10-07 DIAGNOSIS — Z0001 Encounter for general adult medical examination with abnormal findings: Secondary | ICD-10-CM | POA: Diagnosis not present

## 2021-10-07 MED ORDER — GABAPENTIN 300 MG PO CAPS: 300.0000 mg | ORAL_CAPSULE | Freq: Three times a day (TID) | ORAL | 1 refills | Status: DC

## 2021-10-07 MED ORDER — EST ESTROGENS-METHYLTEST 1.25-2.5 MG PO TABS: 1.0000 | ORAL_TABLET | Freq: Every day | ORAL | 1 refills | Status: DC

## 2021-10-07 NOTE — Progress Notes (Signed)
Established Patient Office Visit  Subjective:  Patient ID: Sara Peck, female    DOB: [birth year]  Age: 40 y.o. MRN: [id number]  CC:  Chief Complaint  Patient presents with   Annual Exam    HPI Sara Peck presents for annual wellness visit. Her family has had the flu so she did not get flu shot. She has made full recovery. She states that she has intermittent chest discomfort which she does associate with anxiety. This is unchanged from her baseline.  She does need to have refills for her hormone replacement.  She has no new concerns or complaint today.   Past Medical History:  Diagnosis Date   ADD (attention deficit disorder)    Chronic kidney disease    Endometriosis    Headache    rare migraines.  stress HA more often   Right flank pain    Sees Dr. Weyman Rodney at [facility]   Thin basement membrane disease    sees Clarise Cruz, MD at [facility]    Past Surgical History:  Procedure Laterality Date   ABDOMINAL HYSTERECTOMY  12-27-13   APPENDECTOMY  12-27-13   COLONOSCOPY  2014   [facility]   LAPAROSCOPY     multiple   MOLE REMOVAL     MYRINGOTOMY WITH TUBE PLACEMENT Bilateral 03/18/2016   Procedure: MYRINGOTOMY WITH TUBE PLACEMENT;  Surgeon: Beverly Gust, MD;  Location: [place];  Service: ENT;  Laterality: Bilateral;  [place]   NASAL ENDOSCOPY  2014   [facility]   PARTIAL HYSTERECTOMY  Jan 2012   with left ovary/tube   renal autotransplantion     SKIN LESION EXCISION  2015   back   TYMPANOSTOMY TUBE PLACEMENT  Jan 2012    Family History  Problem Relation Age of Onset   Cancer Mother        thyroid   High blood pressure Mother    High Cholesterol Father    Diabetes Paternal Grandfather     Social History   Socioeconomic History   Marital status: Married    Spouse name: Not on file   Number of children: Not on file   Years of education: Not on file   Highest education level: Not on file  Occupational History   Not on file  Tobacco Use    Smoking status: Former    Packs/day: 0.50    Years: 5.00    Pack years: 2.50    Types: Cigarettes   Smokeless tobacco: Never  Vaping Use   Vaping Use: Never used  Substance and Sexual Activity   Alcohol use: Not Currently    Comment: occasionally beer (holidays, special occasions)   Drug use: No   Sexual activity: Yes    Partners: Male  Other Topics Concern   Not on file  Social History Narrative   Not on file   Social Determinants of Health   Financial Resource Strain: Not on file  Food Insecurity: Not on file  Transportation Needs: Not on file  Physical Activity: Not on file  Stress: Not on file  Social Connections: Not on file  Intimate Partner Violence: Not on file    Outpatient Medications Prior to Visit  Medication Sig Dispense Refill   clorazepate (TRANXENE) 7.5 MG tablet TAKE 1 TABLET BY MOUTH TWICE A DAY AS NEEDED FOR ANXIETY 60 tablet 2   hydrochlorothiazide (HYDRODIURIL) 12.5 MG tablet Take 1/2 to 1 tablet po QD prn edema 90 tablet 1   ipratropium (ATROVENT)  0.06 % nasal spray SMARTSIG:2 Puff(s) Both Nares Every 8 Hours PRN     lidocaine (LIDODERM) 5 % Place 1 patch onto the skin daily. Remove & Discard patch within 12 hours or as directed by MD 30 patch 3   promethazine (PHENERGAN) 25 MG tablet Take 1 tablet (25 mg total) by mouth every 8 (eight) hours as needed for nausea or vomiting. 30 tablet 2   estrogens-methylTEST (ESTRATEST) 1.25-2.5 MG tablet Take 1 tablet by mouth daily. 90 tablet 1   gabapentin (NEURONTIN) 300 MG capsule Take 1 capsule (300 mg total) by mouth 3 (three) times daily. 270 capsule 1   amoxicillin-clavulanate (AUGMENTIN) 875-125 MG tablet Take 1 tablet by mouth every 12 (twelve) hours. (Patient not taking: Reported on 10/07/2021) 14 tablet 0   cetirizine (ZYRTEC ALLERGY) 10 MG tablet Take 1 tablet (10 mg total) by mouth daily. (Patient not taking: Reported on 10/07/2021) 30 tablet 0   ciprofloxacin (CIPRO) 500 MG tablet Take 500 mg by mouth 2  (two) times daily. (Patient not taking: Reported on 10/07/2021)     fluticasone (FLONASE) 50 MCG/ACT nasal spray Place 1 spray into both nostrils daily. (Patient not taking: Reported on 10/07/2021) 11.1 mL 0   No facility-administered medications prior to visit.    Allergies  Allergen Reactions   Cephalexin Hives    Other Reaction: Other reaction    Nitrofurantoin Hives   Other Hives and Itching   Sulfa Antibiotics Hives   Topiramate Hives    ROS Review of Systems  Constitutional:  Negative for activity change, chills, fatigue and unexpected weight change.  HENT:  Negative for congestion, postnasal drip, rhinorrhea, sneezing and sore throat.   Eyes: Negative.   Respiratory:  Negative for cough, chest tightness, shortness of breath and wheezing.   Cardiovascular:  Negative for chest pain and palpitations.  Gastrointestinal:  Negative for abdominal pain, constipation, diarrhea, nausea and vomiting.  Endocrine: Negative for cold intolerance, heat intolerance, polydipsia and polyuria.  Genitourinary:  Negative for dysuria, flank pain, frequency and hematuria.       Right flank pain and skin and subcutaneous layers and skin.  Likely from surgical scarring.  Musculoskeletal:  Negative for arthralgias, back pain, joint swelling and neck pain.  Skin:  Negative for rash.  Allergic/Immunologic: Negative for environmental allergies.  Neurological:  Negative for dizziness, tremors, numbness and headaches.  Hematological:  Negative for adenopathy. Does not bruise/bleed easily.  Psychiatric/Behavioral:  Positive for decreased concentration. Negative for behavioral problems (Depression), sleep disturbance and suicidal ideas. The patient is nervous/anxious.      Objective:    Physical Exam Vitals and nursing note reviewed.  Constitutional:      Appearance: Normal appearance. She is well-developed.  HENT:     Head: Normocephalic and atraumatic.     Right Ear: Tympanic membrane, ear canal and  external ear normal.     Left Ear: Tympanic membrane, ear canal and external ear normal.     Nose: Nose normal.     Mouth/Throat:     Mouth: Mucous membranes are moist.     Pharynx: Oropharynx is clear.  Eyes:     Extraocular Movements: Extraocular movements intact.     Conjunctiva/sclera: Conjunctivae normal.     Pupils: Pupils are equal, round, and reactive to light.  Cardiovascular:     Rate and Rhythm: Normal rate and regular rhythm.     Pulses: Normal pulses.     Heart sounds: Normal heart sounds.  Pulmonary:  Effort: Pulmonary effort is normal.     Breath sounds: Normal breath sounds.  Chest:  Breasts:    Right: Normal. No swelling, bleeding, inverted nipple, mass, nipple discharge, skin change or tenderness.     Left: Normal. No swelling, bleeding, inverted nipple, mass, nipple discharge, skin change or tenderness.  Abdominal:     General: Bowel sounds are normal. There is no distension.     Palpations: Abdomen is soft. There is no mass.     Tenderness: There is no abdominal tenderness. There is no guarding or rebound.     Hernia: No hernia is present.  Musculoskeletal:        General: Normal range of motion.     Cervical back: Normal range of motion and neck supple.  Lymphadenopathy:     Cervical: No cervical adenopathy.  Skin:    General: Skin is warm and dry.     Capillary Refill: Capillary refill takes less than 2 seconds.  Neurological:     General: No focal deficit present.     Mental Status: She is alert and oriented to person, place, and time.  Psychiatric:        Mood and Affect: Mood normal.        Behavior: Behavior normal.        Thought Content: Thought content normal.        Judgment: Judgment normal.    Today's Vitals   10/07/21 0931  BP: 113/72  Pulse: 78  Temp: 98.1 F (36.7 C)  SpO2: 99%  Weight: 144 lb 4.8 oz (65.5 kg)  Height: 5' 5" (1.651 m)   Body mass index is 24.01 kg/m.   Wt Readings from Last 3 Encounters:  10/07/21 144 lb  4.8 oz (65.5 kg)  05/07/21 142 lb 8 oz (64.6 kg)  01/29/21 144 lb 11.2 oz (65.6 kg)     There are no preventive care reminders to display for this patient.  There are no preventive care reminders to display for this patient.  Lab Results  Component Value Date   TSH 1.200 04/23/2021   Lab Results  Component Value Date   WBC 7.9 04/23/2021   HGB 14.8 04/23/2021   HCT 44.1 04/23/2021   MCV 94 04/23/2021   PLT 331 04/23/2021   Lab Results  Component Value Date   NA 142 04/23/2021   K 4.6 04/23/2021   CO2 22 04/23/2021   GLUCOSE 82 04/23/2021   BUN 13 04/23/2021   CREATININE 0.92 04/23/2021   BILITOT 1.1 04/23/2021   ALKPHOS 100 04/23/2021   AST 14 04/23/2021   ALT 11 04/23/2021   PROT 7.1 04/23/2021   ALBUMIN 4.8 04/23/2021   CALCIUM 9.3 04/23/2021   ANIONGAP 8 08/14/2015   EGFR 82 04/23/2021   Lab Results  Component Value Date   CHOL 149 04/23/2021   Lab Results  Component Value Date   HDL 42 04/23/2021   Lab Results  Component Value Date   LDLCALC 92 04/23/2021   Lab Results  Component Value Date   TRIG 80 04/23/2021   Lab Results  Component Value Date   CHOLHDL 3.5 04/23/2021   Lab Results  Component Value Date   HGBA1C 5.5 04/23/2021      Assessment & Plan:  1. Encounter for general adult medical examination with abnormal findings Annual wellness visit.   2. Artificial menopause Continue hormone therapy as prescribed. Refills provided today.  - estrogens-methylTEST (ESTRATEST) 1.25-2.5 MG tablet; Take 1 tablet by mouth daily.  Dispense: 90 tablet; Refill: 1  3. Neuropathic pain Continue gabapentin as prescribed. Refills provided today.  - gabapentin (NEURONTIN) 300 MG capsule; Take 1 capsule (300 mg total) by mouth 3 (three) times daily.  Dispense: 270 capsule; Refill: 1  4. GAD (generalized anxiety disorder) May continue tranzene as previously prescribed will refill as indicated.    Problem List Items Addressed This Visit        Other   Artificial menopause   Relevant Medications   estrogens-methylTEST (ESTRATEST) 1.25-2.5 MG tablet   Encounter for general adult medical examination with abnormal findings - Primary   GAD (generalized anxiety disorder)   Neuropathic pain   Relevant Medications   gabapentin (NEURONTIN) 300 MG capsule    Meds ordered this encounter  Medications   estrogens-methylTEST (ESTRATEST) 1.25-2.5 MG tablet    Sig: Take 1 tablet by mouth daily.    Dispense:  90 tablet    Refill:  1    Order Specific Question:   Supervising Provider    Answer:   Beatrice Lecher D [[id number]]   gabapentin (NEURONTIN) 300 MG capsule    Sig: Take 1 capsule (300 mg total) by mouth 3 (three) times daily.    Dispense:  270 capsule    Refill:  1    Written prescription given to patient today.    Order Specific Question:   Supervising Provider    Answer:   Beatrice Lecher D [[id number]]    Follow-up: Return in about 5 months (around 03/07/2022) for hormones - will need fasting blod work at time of visit .    Ronnell Freshwater, NP

## 2021-10-25 ENCOUNTER — Other Ambulatory Visit: Payer: BC Managed Care – PPO | Admitting: Hospice and Palliative Medicine

## 2021-11-26 ENCOUNTER — Ambulatory Visit
Admission: EM | Admit: 2021-11-26 | Discharge: 2021-11-26 | Disposition: A | Discharge status: Home / Self Care | Payer: 59 | Attending: Internal Medicine | Admitting: Internal Medicine

## 2021-11-26 ENCOUNTER — Encounter: Payer: Self-pay | Admitting: Emergency Medicine

## 2021-11-26 DIAGNOSIS — J019 Acute sinusitis, unspecified: Secondary | ICD-10-CM

## 2021-11-26 DIAGNOSIS — Z1152 Encounter for screening for COVID-19: Secondary | ICD-10-CM | POA: Diagnosis not present

## 2021-11-26 DIAGNOSIS — B9789 Other viral agents as the cause of diseases classified elsewhere: Secondary | ICD-10-CM | POA: Diagnosis not present

## 2021-11-26 MED ORDER — SALINE SPRAY 0.65 % NA SOLN: 1.0000 | NASAL | 0 refills | Status: DC | PRN

## 2021-11-26 NOTE — ED Provider Notes (Signed)
Roderic Palau    CSN: [id number] Arrival date & time: 11/26/21  [id number]      History   Chief Complaint Chief Complaint  Patient presents with   Cough   Headache   Sore Throat   Nasal Congestion    HPI Sara Peck is a 41 y.o. female comes to the urgent care with a 2-day history of nasal congestion, nonproductive cough, postnasal drainage and a fever of 100 F.  Patient's symptoms have been persistent.  She is currently on ipratropium nasal spray prescribed by her ENT specialist.  She denies any pain in the ears or ear fullness.  No ringing in the ears.  No dizziness.  Patient denies any shortness of breath, wheezing or chest pain/tightness.  No sick contacts.  She is fully vaccinated against COVID-19 virus.  Patient denies any generalized body aches or significant fatigue. HPI  Past Medical History:  Diagnosis Date   ADD (attention deficit disorder)    Chronic kidney disease    Endometriosis    Headache    rare migraines.  stress HA more often   Right flank pain    Sees Dr. Weyman Rodney at [facility]   Thin basement membrane disease    sees Clarise Cruz, MD at [facility]    Patient Active Problem List   Diagnosis Date Noted   Generalized edema 05/10/2021   Concentration deficit 05/10/2021   Encounter to establish care 02/03/2021   Hematuria 02/03/2021   Nausea 02/03/2021   Routine cervical smear 11/24/2020   Incisional pain 04/12/2020   Neuropathic pain 04/12/2020   Personal history of other malignant neoplasm of skin 10/23/2018   Urinary tract infection with hematuria 07/22/2018   Dysuria 07/22/2018   Pain syndrome, chronic 02/16/2018   GAD (generalized anxiety disorder) 12/15/2017   Thin basement membrane nephropathy 04/12/2017   Migraine without aura and without status migrainosus, not intractable 09/15/2016   Endometriosis 01/19/2015   Adult attention deficit disorder 12/24/2014   Hematoma, postoperative 09/20/2014   Microscopic hematuria 09/20/2014   Abdominal  pain, unspecified site 03/28/2014   Artificial menopause 01/08/2014   Encounter for general adult medical examination with abnormal findings 12/03/2012    Past Surgical History:  Procedure Laterality Date   ABDOMINAL HYSTERECTOMY  12-27-13   APPENDECTOMY  12-27-13   COLONOSCOPY  2014   [facility]   LAPAROSCOPY     multiple   MOLE REMOVAL     MYRINGOTOMY WITH TUBE PLACEMENT Bilateral 03/18/2016   Procedure: MYRINGOTOMY WITH TUBE PLACEMENT;  Surgeon: Beverly Gust, MD;  Location: [facility];  Service: ENT;  Laterality: Bilateral;  [place]   NASAL ENDOSCOPY  2014   [facility]   PARTIAL HYSTERECTOMY  Jan 2012   with left ovary/tube   renal autotransplantion     SKIN LESION EXCISION  2015   back   TYMPANOSTOMY TUBE PLACEMENT  Jan 2012    OB History     Gravida  2   Para  2   Term      Preterm      AB      Living         SAB      IAB      Ectopic      Multiple      Live Births           Obstetric Comments  1st Menstrual Cycle:  15 1st Pregnancy:  43 Menopause age 16  Home Medications    Prior to Admission medications   Medication Sig Start Date End Date Taking? Authorizing Provider  sodium chloride (OCEAN) 0.65 % SOLN nasal spray Place 1 spray into both nostrils as needed for congestion. 11/26/21  Yes Armon Orvis, Myrene Galas, MD  clorazepate (TRANXENE) 7.5 MG tablet TAKE 1 TABLET BY MOUTH TWICE A DAY AS NEEDED FOR ANXIETY 05/07/21   Ronnell Freshwater, NP  estrogens-methylTEST (ESTRATEST) 1.25-2.5 MG tablet Take 1 tablet by mouth daily. 10/07/21   Ronnell Freshwater, NP  gabapentin (NEURONTIN) 300 MG capsule Take 1 capsule (300 mg total) by mouth 3 (three) times daily. 10/07/21   Ronnell Freshwater, NP  hydrochlorothiazide (HYDRODIURIL) 12.5 MG tablet Take 1/2 to 1 tablet po QD prn edema 05/07/21   Ronnell Freshwater, NP  ipratropium (ATROVENT) 0.06 % nasal spray SMARTSIG:2 Puff(s) Both Nares Every 8 Hours PRN 09/09/21   [provider]   lidocaine (LIDODERM) 5 % Place 1 patch onto the skin daily. Remove & Discard patch within 12 hours or as directed by MD 01/29/21   Ronnell Freshwater, NP  promethazine (PHENERGAN) 25 MG tablet Take 1 tablet (25 mg total) by mouth every 8 (eight) hours as needed for nausea or vomiting. 01/29/21   Ronnell Freshwater, NP    Family History Family History  Problem Relation Age of Onset   Cancer Mother        thyroid   High blood pressure Mother    High Cholesterol Father    Diabetes Paternal Grandfather     Social History Social History   Tobacco Use   Smoking status: Former    Packs/day: 0.50    Years: 5.00    Pack years: 2.50    Types: Cigarettes   Smokeless tobacco: Never  Vaping Use   Vaping Use: Never used  Substance Use Topics   Alcohol use: Not Currently    Comment: occasionally beer (holidays, special occasions)   Drug use: No     Allergies   Cephalexin, Nitrofurantoin, Other, Sulfa antibiotics, and Topiramate   Review of Systems Review of Systems  HENT:  Positive for postnasal drip, sinus pressure and sore throat. Negative for ear discharge, ear pain, facial swelling and mouth sores.   Cardiovascular: Negative.   Musculoskeletal: Negative.   Skin: Negative.     Physical Exam Triage Vital Signs ED Triage Vitals  Enc Vitals Group     BP 11/26/21 1004 125/78     Pulse Rate 11/26/21 1004 86     Resp 11/26/21 1004 16     Temp 11/26/21 1004 98.2 F (36.8 C)     Temp Source 11/26/21 1004 Oral     SpO2 11/26/21 1004 96 %     Weight --      Height --      Head Circumference --      Peak Flow --      Pain Score 11/26/21 1009 0     Pain Loc --      Pain Edu? --      Excl. in [place]? --    No data found.  Updated Vital Signs BP 125/78 (BP Location: Left Arm)    Pulse 86    Temp 98.2 F (36.8 C) (Oral)    Resp 16    SpO2 96%   Visual Acuity Right Eye Distance:   Left Eye Distance:   Bilateral Distance:    Right Eye Near:   Left Eye Near:  Bilateral  Near:     Physical Exam Vitals and nursing note reviewed.  Constitutional:      General: She is not in acute distress.    Appearance: She is not ill-appearing.  HENT:     Mouth/Throat:     Mouth: Mucous membranes are moist.     Pharynx: Oropharynx is clear.  Cardiovascular:     Rate and Rhythm: Normal rate and regular rhythm.     Heart sounds: Normal heart sounds.  Pulmonary:     Effort: Pulmonary effort is normal.  Musculoskeletal:     Cervical back: Normal range of motion.  Neurological:     Mental Status: She is alert.     UC Treatments / Results  Labs (all labs ordered are listed, but only abnormal results are displayed) Labs Reviewed  NOVEL CORONAVIRUS, NAA    EKG   Radiology No results found.  Procedures Procedures (including critical care time)  Medications Ordered in UC Medications - No data to display  Initial Impression / Assessment and Plan / UC Course  I have reviewed the triage vital signs and the nursing notes.  Pertinent labs & imaging results that were available during my care of the patient were reviewed by me and considered in my medical decision making (see chart for details).     1.  Acute viral sinusitis: COVID-19 PCR test has been sent. Continue nasal spray Maintain adequate hydration Saline nasal spray will help with management We will call you with recommendations if labs are abnormal Return precautions given. Final Clinical Impressions(s) / UC Diagnoses   Final diagnoses:  Encounter for screening for COVID-19  Acute viral sinusitis     Discharge Instructions      Discontinue current medications Saline nasal spray will help with symptoms Humidifier use at night will help with symptoms We will call you with recommendations if labs are abnormal Return to urgent care if symptoms worsen.   ED Prescriptions     Medication Sig Dispense Auth. Provider   sodium chloride (OCEAN) 0.65 % SOLN nasal spray Place 1 spray into both  nostrils as needed for congestion. -- Chase Picket, MD      PDMP not reviewed this encounter.   Chase Picket, MD 11/26/21 1110

## 2021-11-26 NOTE — Discharge Instructions (Signed)
Discontinue current medications Saline nasal spray will help with symptoms Humidifier use at night will help with symptoms We will call you with recommendations if labs are abnormal Return to urgent care if symptoms worsen.

## 2021-11-26 NOTE — ED Triage Notes (Signed)
Pt here with cough, congestion, sore throat, headache and low grade fever x 2 days.

## 2021-11-27 LAB — NOVEL CORONAVIRUS, NAA: SARS-CoV-2, NAA: DETECTED — AB

## 2021-11-27 LAB — SARS-COV-2, NAA 2 DAY TAT

## 2022-02-20 ENCOUNTER — Other Ambulatory Visit: Payer: Self-pay | Admitting: Nurse Practitioner

## 2022-02-20 DIAGNOSIS — N958 Other specified menopausal and perimenopausal disorders: Secondary | ICD-10-CM

## 2022-03-07 ENCOUNTER — Encounter: Payer: Self-pay | Admitting: Nurse Practitioner

## 2022-03-07 ENCOUNTER — Ambulatory Visit: Payer: 59 | Admitting: Nurse Practitioner

## 2022-03-07 VITALS — BP 105/69 | HR 85 | Temp 98.1°F | Ht 65.0 in | Wt 147.0 lb

## 2022-03-07 DIAGNOSIS — Z0000 Encounter for general adult medical examination without abnormal findings: Secondary | ICD-10-CM

## 2022-03-07 DIAGNOSIS — F411 Generalized anxiety disorder: Secondary | ICD-10-CM

## 2022-03-07 DIAGNOSIS — M792 Neuralgia and neuritis, unspecified: Secondary | ICD-10-CM

## 2022-03-07 DIAGNOSIS — N390 Urinary tract infection, site not specified: Secondary | ICD-10-CM

## 2022-03-07 DIAGNOSIS — N958 Other specified menopausal and perimenopausal disorders: Secondary | ICD-10-CM | POA: Diagnosis not present

## 2022-03-07 DIAGNOSIS — R110 Nausea: Secondary | ICD-10-CM

## 2022-03-07 DIAGNOSIS — R319 Hematuria, unspecified: Secondary | ICD-10-CM

## 2022-03-07 DIAGNOSIS — E039 Hypothyroidism, unspecified: Secondary | ICD-10-CM | POA: Diagnosis not present

## 2022-03-07 LAB — POCT URINALYSIS DIP (CLINITEK)
Bilirubin, UA: NEGATIVE
Glucose, UA: NEGATIVE mg/dL
Ketones, POC UA: NEGATIVE mg/dL
Leukocytes, UA: NEGATIVE
Nitrite, UA: NEGATIVE
POC PROTEIN,UA: NEGATIVE
Spec Grav, UA: 1.015 (ref 1.010–1.025)
Urobilinogen, UA: 0.2 E.U./dL
pH, UA: 6 (ref 5.0–8.0)

## 2022-03-07 MED ORDER — CLORAZEPATE DIPOTASSIUM 7.5 MG PO TABS: ORAL_TABLET | ORAL | 3 refills | Status: DC

## 2022-03-07 MED ORDER — GABAPENTIN 300 MG PO CAPS: 300.0000 mg | ORAL_CAPSULE | Freq: Three times a day (TID) | ORAL | 1 refills | Status: DC

## 2022-03-07 MED ORDER — PROMETHAZINE HCL 25 MG PO TABS: 25.0000 mg | ORAL_TABLET | Freq: Three times a day (TID) | ORAL | 3 refills | Status: DC | PRN

## 2022-03-07 MED ORDER — EST ESTROGENS-METHYLTEST 1.25-2.5 MG PO TABS: 1.0000 | ORAL_TABLET | Freq: Every day | ORAL | 1 refills | Status: DC

## 2022-03-07 NOTE — Progress Notes (Signed)
Established patient visit ? ? ?Patient: Sara Peck   DOB: Nov 18, 1980   41 y.o. Female  MRN: [id number] ?Visit Date: 03/07/2022 ? ? ?Chief Complaint  ?Patient presents with  ? Follow-up  ? ?Subjective  ?  ?HPI  ?Routine follow up visit.  ?-artificial menopause after having complete hysterectomy. Takes Futures trader and will need new prescription.  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  ?-last week, did have symptoms related to having UTI. She took her standing prescription for cipro twice daily for 7 days. She often can't tell if this resolves the infection or not. Will check u/a during today's visit.  ?-does take tranxene as needed for acute anxiety. She states that she has not had to have new prescription for this today.  ?-does get very nauseated with UTIs. Takes phenergan to help with the nausea she gets. She also needs new prescription for this.  ?-PDMP profile reviewed today - overdse risk score is 210. Last fill of tranxene was 05/2021. Fills of estrotest are appropriate.  ? ? ?Medications: ?Outpatient Medications Prior to Visit  ?Medication Sig  ? ipratropium (ATROVENT) 0.06 % nasal spray SMARTSIG:2 Puff(s) Both Nares Every 8 Hours PRN  ? [DISCONTINUED] clorazepate (TRANXENE) 7.5 MG tablet TAKE 1 TABLET BY MOUTH TWICE A DAY AS NEEDED FOR ANXIETY  ? [DISCONTINUED] estrogens-methylTEST (ESTRATEST) 1.25-2.5 MG tablet TAKE 1 TABLET BY MOUTH EVERY DAY  ? [DISCONTINUED] gabapentin (NEURONTIN) 300 MG capsule Take 1 capsule (300 mg total) by mouth 3 (three) times daily.  ? [DISCONTINUED] promethazine (PHENERGAN) 25 MG tablet Take 1 tablet (25 mg total) by mouth every 8 (eight) hours as needed for nausea or vomiting.  ? [DISCONTINUED] hydrochlorothiazide (HYDRODIURIL) 12.5 MG tablet Take 1/2 to 1 tablet po QD prn edema  ? [DISCONTINUED] lidocaine (LIDODERM) 5 % Place 1 patch onto the skin daily. Remove & Discard patch within 12 hours or as directed by MD  ? [DISCONTINUED]  sodium chloride (OCEAN) 0.65 % SOLN nasal spray Place 1 spray into both nostrils as needed for congestion.  ? ?No facility-administered medications prior to visit.  ? ? ?Review of Systems  ?Constitutional:  Positive for fatigue. Negative for activity change, appetite change, chills and fever.  ?HENT:  Negative for congestion, postnasal drip, rhinorrhea, sinus pressure, sinus pain, sneezing and sore throat.   ?Eyes: Negative.   ?Respiratory:  Negative for cough, chest tightness, shortness of breath and wheezing.   ?Cardiovascular:  Negative for chest pain and palpitations.  ?Gastrointestinal:  Positive for nausea. Negative for abdominal pain, constipation, diarrhea and vomiting.  ?     Nausea is associated with uti   ?Endocrine: Negative for cold intolerance, heat intolerance, polydipsia and polyuria.  ?Genitourinary:  Positive for flank pain and hematuria. Negative for dyspareunia, dysuria, frequency and urgency.  ?Musculoskeletal:  Negative for arthralgias, back pain and myalgias.  ?Skin:  Negative for rash.  ?Allergic/Immunologic: Negative for environmental allergies.  ?Neurological:  Negative for dizziness, weakness and headaches.  ?Hematological:  Negative for adenopathy.  ?Psychiatric/Behavioral:  The patient is nervous/anxious.   ? ? ? Objective  ?  ? ?Today's Vitals  ? 03/07/22 0924  ?BP: 105/69  ?Pulse: 85  ?Temp: 98.1 ?F (36.7 ?C)  ?SpO2: 100%  ?Weight: 147 lb (66.7 kg)  ?Height: '5\' 5"'  (1.651 m)  ? ?Body mass index is 24.46 kg/m?.  ? ?BP Readings from Last 3 Encounters:  ?03/07/22 105/69  ?11/26/21 125/78  ?10/07/21 113/72  ?  ?Wt Readings from Last 3  Encounters:  ?03/07/22 147 lb (66.7 kg)  ?10/07/21 144 lb 4.8 oz (65.5 kg)  ?05/07/21 142 lb 8 oz (64.6 kg)  ?  ?Physical Exam ?Vitals and nursing note reviewed.  ?Constitutional:   ?   Appearance: Normal appearance. She is well-developed.  ?HENT:  ?   Head: Normocephalic and atraumatic.  ?   Nose: Nose normal.  ?   Mouth/Throat:  ?   Mouth: Mucous membranes  are moist.  ?   Pharynx: Oropharynx is clear.  ?Eyes:  ?   Extraocular Movements: Extraocular movements intact.  ?   Conjunctiva/sclera: Conjunctivae normal.  ?   Pupils: Pupils are equal, round, and reactive to light.  ?Cardiovascular:  ?   Rate and Rhythm: Normal rate and regular rhythm.  ?   Pulses: Normal pulses.  ?   Heart sounds: Normal heart sounds.  ?Pulmonary:  ?   Effort: Pulmonary effort is normal.  ?   Breath sounds: Normal breath sounds.  ?Abdominal:  ?   General: Bowel sounds are normal. There is no distension.  ?   Palpations: Abdomen is soft. There is no mass.  ?   Tenderness: There is no abdominal tenderness. There is no guarding or rebound.  ?   Hernia: No hernia is present.  ?Genitourinary: ?   Comments: U/A positive for moderate blood  ?Musculoskeletal:     ?   General: Normal range of motion.  ?   Cervical back: Normal range of motion and neck supple.  ?Lymphadenopathy:  ?   Cervical: No cervical adenopathy.  ?Skin: ?   General: Skin is warm and dry.  ?   Capillary Refill: Capillary refill takes less than 2 seconds.  ?Neurological:  ?   General: No focal deficit present.  ?   Mental Status: She is alert and oriented to person, place, and time.  ?Psychiatric:     ?   Mood and Affect: Mood normal.     ?   Behavior: Behavior normal.     ?   Thought Content: Thought content normal.     ?   Judgment: Judgment normal.  ?  ? ? ?Results for orders placed or performed in visit on 03/07/22  ?POCT URINALYSIS DIP (CLINITEK)  ?Result Value Ref Range  ? Color, UA yellow yellow  ? Clarity, UA clear clear  ? Glucose, UA negative negative mg/dL  ? Bilirubin, UA negative negative  ? Ketones, POC UA negative negative mg/dL  ? Spec Grav, UA 1.015 1.010 - 1.025  ? Blood, UA moderate (A) negative  ? pH, UA 6.0 5.0 - 8.0  ? POC PROTEIN,UA negative negative, trace  ? Urobilinogen, UA 0.2 0.2 or 1.0 E.U./dL  ? Nitrite, UA Negative Negative  ? Leukocytes, UA Negative Negative  ? ? Assessment & Plan  ?  ?1. Urinary tract  infection with hematuria, site unspecified ?Urine sample positive for moderate blood. Will have her treat with standing order for cipro bid for 10 days per her urologist. Will send urine for culture and sensitivity and adjust antibiotics as indicated.  ?- Urine Culture; Future ?- Urine Culture ?- POCT URINALYSIS DIP (CLINITEK) ? ?2. Artificial menopause ?Continue Estratest as prescribed. New prescriptions sent to her pharmacy today ?- estrogens-methylTEST (ESTRATEST) 1.25-2.5 MG tablet; Take 1 tablet by mouth daily.  Dispense: 90 tablet; Refill: 1 ? ?3. Acquired hypothyroidism ?Check thyrooid panel along with routine, fasting labs. ?- TSH; Future ?- Hemoglobin A1c; Future ?- Lipid panel; Future ?- CBC with Differential/Platelet; Future ?-  Comp Met (CMET); Future ?- Comp Met (CMET) ?- CBC with Differential/Platelet ?- Lipid panel ?- Hemoglobin A1c ?- TSH ? ?4. Neuropathic pain ?May continue to take gabapentin 310m 2 to 3 times per day as needed.  ?- gabapentin (NEURONTIN) 300 MG capsule; Take 1 capsule (300 mg total) by mouth 3 (three) times daily.  Dispense: 270 capsule; Refill: 1 ? ?5. GAD (generalized anxiety disorder) ?May take tranxene 7.581mtwice daily as needed for acute anxiety. New prescription sent to pharmacy today.  ?- clorazepate (TRANXENE) 7.5 MG tablet; TAKE 1 TABLET BY MOUTH TWICE A DAY AS NEEDED FOR ANXIETY  Dispense: 60 tablet; Refill: 3 ? ?6. Nausea ?May take promethazine up to three times daily when needed for nausea which is associated with relatively frequent UTI ?- promethazine (PHENERGAN) 25 MG tablet; Take 1 tablet (25 mg total) by mouth every 8 (eight) hours as needed for nausea or vomiting.  Dispense: 30 tablet; Refill: 3 ? ?7. Healthcare maintenance ?Routine, fasting labs drawn during today's visit.  ?- TSH; Future ?- Hemoglobin A1c; Future ?- Lipid panel; Future ?- CBC with Differential/Platelet; Future ?- Comp Met (CMET); Future ?- Comp Met (CMET) ?- CBC with Differential/Platelet ?-  Lipid panel ?- Hemoglobin A1c ?- TSH  ? ? ?Problem List Items Addressed This Visit   ? ?  ? Endocrine  ? Acquired hypothyroidism  ? Relevant Orders  ? TSH  ? Hemoglobin A1c  ? Lipid panel  ? CBC with Diff

## 2022-03-08 ENCOUNTER — Encounter: Payer: Self-pay | Admitting: Nurse Practitioner

## 2022-03-08 LAB — CBC WITH DIFFERENTIAL/PLATELET
Basophils Absolute: 0.1 10*3/uL (ref 0.0–0.2)
Basos: 1 %
EOS (ABSOLUTE): 0.3 10*3/uL (ref 0.0–0.4)
Eos: 4 %
Hematocrit: 42.3 % (ref 34.0–46.6)
Hemoglobin: 14.3 g/dL (ref 11.1–15.9)
Immature Grans (Abs): 0 10*3/uL (ref 0.0–0.1)
Immature Granulocytes: 0 %
Lymphocytes Absolute: 2.3 10*3/uL (ref 0.7–3.1)
Lymphs: 27 %
MCH: 31.4 pg (ref 26.6–33.0)
MCHC: 33.8 g/dL (ref 31.5–35.7)
MCV: 93 fL (ref 79–97)
Monocytes Absolute: 0.5 10*3/uL (ref 0.1–0.9)
Monocytes: 6 %
Neutrophils Absolute: 5.3 10*3/uL (ref 1.4–7.0)
Neutrophils: 62 %
Platelets: 342 10*3/uL (ref 150–450)
RBC: 4.56 x10E6/uL (ref 3.77–5.28)
RDW: 12.9 % (ref 11.7–15.4)
WBC: 8.6 10*3/uL (ref 3.4–10.8)

## 2022-03-08 LAB — COMPREHENSIVE METABOLIC PANEL
ALT: 11 IU/L (ref 0–32)
AST: 18 IU/L (ref 0–40)
Albumin/Globulin Ratio: 2.3 — ABNORMAL HIGH (ref 1.2–2.2)
Albumin: 4.8 g/dL (ref 3.8–4.8)
Alkaline Phosphatase: 89 IU/L (ref 44–121)
BUN/Creatinine Ratio: 16 (ref 9–23)
BUN: 14 mg/dL (ref 6–20)
Bilirubin Total: 0.5 mg/dL (ref 0.0–1.2)
CO2: 24 mmol/L (ref 20–29)
Calcium: 9.5 mg/dL (ref 8.7–10.2)
Chloride: 103 mmol/L (ref 96–106)
Creatinine, Ser: 0.9 mg/dL (ref 0.57–1.00)
Globulin, Total: 2.1 g/dL (ref 1.5–4.5)
Glucose: 86 mg/dL (ref 70–99)
Potassium: 4.6 mmol/L (ref 3.5–5.2)
Sodium: 140 mmol/L (ref 134–144)
Total Protein: 6.9 g/dL (ref 6.0–8.5)
eGFR: 83 mL/min/{1.73_m2} (ref >59)

## 2022-03-08 LAB — TSH: TSH: 1.31 u[IU]/mL (ref 0.450–4.500)

## 2022-03-08 LAB — LIPID PANEL
Chol/HDL Ratio: 3.2 ratio (ref 0.0–4.4)
Cholesterol, Total: 138 mg/dL (ref 100–199)
HDL: 43 mg/dL (ref >39)
LDL Chol Calc (NIH): 83 mg/dL (ref 0–99)
Triglycerides: 55 mg/dL (ref 0–149)
VLDL Cholesterol Cal: 12 mg/dL (ref 5–40)

## 2022-03-08 LAB — HEMOGLOBIN A1C
Est. average glucose Bld gHb Est-mCnc: 111 mg/dL
Hgb A1c MFr Bld: 5.5 % (ref 4.8–5.6)

## 2022-03-09 ENCOUNTER — Ambulatory Visit: Payer: 59 | Admitting: Nurse Practitioner

## 2022-03-09 LAB — URINE CULTURE

## 2022-03-10 NOTE — Progress Notes (Signed)
Urine culture showing mixed normal flora.

## 2022-05-20 ENCOUNTER — Encounter: Payer: Self-pay | Admitting: Nurse Practitioner

## 2022-09-04 ENCOUNTER — Other Ambulatory Visit: Payer: Self-pay | Admitting: Nurse Practitioner

## 2022-09-04 ENCOUNTER — Encounter: Payer: Self-pay | Admitting: Nurse Practitioner

## 2022-09-04 DIAGNOSIS — N958 Other specified menopausal and perimenopausal disorders: Secondary | ICD-10-CM

## 2022-09-05 ENCOUNTER — Encounter: Payer: Self-pay | Admitting: Nurse Practitioner

## 2022-09-06 ENCOUNTER — Other Ambulatory Visit: Payer: Self-pay | Admitting: Nurse Practitioner

## 2022-09-06 DIAGNOSIS — N958 Other specified menopausal and perimenopausal disorders: Secondary | ICD-10-CM

## 2022-09-06 MED ORDER — EST ESTROGENS-METHYLTEST 1.25-2.5 MG PO TABS: 1.0000 | ORAL_TABLET | Freq: Every day | ORAL | 1 refills | Status: DC

## 2022-09-09 ENCOUNTER — Encounter: Payer: 59 | Admitting: Nurse Practitioner

## 2022-09-13 ENCOUNTER — Encounter: Payer: 59 | Admitting: Nurse Practitioner

## 2022-09-23 ENCOUNTER — Ambulatory Visit (INDEPENDENT_AMBULATORY_CARE_PROVIDER_SITE_OTHER): Payer: 59 | Admitting: Nurse Practitioner

## 2022-09-23 ENCOUNTER — Encounter: Payer: Self-pay | Admitting: Nurse Practitioner

## 2022-09-23 VITALS — BP 116/73 | HR 83 | Ht 65.0 in | Wt 143.0 lb

## 2022-09-23 DIAGNOSIS — R109 Unspecified abdominal pain: Secondary | ICD-10-CM

## 2022-09-23 DIAGNOSIS — N390 Urinary tract infection, site not specified: Secondary | ICD-10-CM

## 2022-09-23 DIAGNOSIS — R5383 Other fatigue: Secondary | ICD-10-CM

## 2022-09-23 DIAGNOSIS — E8941 Symptomatic postprocedural ovarian failure: Secondary | ICD-10-CM

## 2022-09-23 DIAGNOSIS — R10A0 Flank pain, unspecified side: Secondary | ICD-10-CM

## 2022-09-23 DIAGNOSIS — Z0001 Encounter for general adult medical examination with abnormal findings: Secondary | ICD-10-CM | POA: Diagnosis not present

## 2022-09-23 DIAGNOSIS — R319 Hematuria, unspecified: Secondary | ICD-10-CM

## 2022-09-23 DIAGNOSIS — Z808 Family history of malignant neoplasm of other organs or systems: Secondary | ICD-10-CM

## 2022-09-23 DIAGNOSIS — M792 Neuralgia and neuritis, unspecified: Secondary | ICD-10-CM

## 2022-09-23 DIAGNOSIS — Z0000 Encounter for general adult medical examination without abnormal findings: Secondary | ICD-10-CM

## 2022-09-23 DIAGNOSIS — N958 Other specified menopausal and perimenopausal disorders: Secondary | ICD-10-CM

## 2022-09-23 LAB — POCT URINALYSIS DIP (CLINITEK)
Bilirubin, UA: NEGATIVE
Glucose, UA: NEGATIVE mg/dL
Ketones, POC UA: NEGATIVE mg/dL
Leukocytes, UA: NEGATIVE
Nitrite, UA: NEGATIVE
POC PROTEIN,UA: NEGATIVE
Spec Grav, UA: 1.02 (ref 1.010–1.025)
Urobilinogen, UA: 0.2 E.U./dL
pH, UA: 7 (ref 5.0–8.0)

## 2022-09-23 MED ORDER — EST ESTROGENS-METHYLTEST 1.25-2.5 MG PO TABS: 1.0000 | ORAL_TABLET | Freq: Every day | ORAL | 1 refills | Status: DC

## 2022-09-23 MED ORDER — CIPROFLOXACIN HCL 500 MG PO TABS: 500.0000 mg | ORAL_TABLET | Freq: Two times a day (BID) | ORAL | 0 refills | Status: DC

## 2022-09-23 MED ORDER — GABAPENTIN 300 MG PO CAPS: 300.0000 mg | ORAL_CAPSULE | Freq: Three times a day (TID) | ORAL | 1 refills | Status: DC

## 2022-09-23 NOTE — Progress Notes (Signed)
Complete physical exam   Patient: Sara Peck   DOB: May 02, 1981   41 y.o. Female  MRN: [id number] Visit Date: 09/23/2022    Chief Complaint  Patient presents with   Annual Exam   Subjective    Sara Peck is a 41 y.o. female who presents today for a complete physical exam.  She reports consuming a  generally healthy  diet. The patient has a physically strenuous job, but has no regular exercise apart from work.  She generally feels fairly well. She does have additional problems to discuss today.   HPI  Annual physical  -unusual fatigue  -curious about hormones.  -has had complete hysterectomy. Has been on same hormone replacement since after surgery.   Past Medical History:  Diagnosis Date   ADD (attention deficit disorder)    Chronic kidney disease    Endometriosis    Headache    rare migraines.  stress HA more often   Right flank pain    Sees Dr. Weyman Rodney at [facility]   Thin basement membrane disease    sees Clarise Cruz, MD at [facility]   Past Surgical History:  Procedure Laterality Date   ABDOMINAL HYSTERECTOMY  12-27-13   APPENDECTOMY  12-27-13   COLONOSCOPY  2014   [facility]   LAPAROSCOPY     multiple   MOLE REMOVAL     MYRINGOTOMY WITH TUBE PLACEMENT Bilateral 03/18/2016   Procedure: MYRINGOTOMY WITH TUBE PLACEMENT;  Surgeon: Beverly Gust, MD;  Location: [place];  Service: ENT;  Laterality: Bilateral;  [place]   NASAL ENDOSCOPY  2014   [facility]   PARTIAL HYSTERECTOMY  Jan 2012   with left ovary/tube   renal autotransplantion     SKIN LESION EXCISION  2015   back   TYMPANOSTOMY TUBE PLACEMENT  Jan 2012   Social History   Socioeconomic History   Marital status: Married    Spouse name: Not on file   Number of children: Not on file   Years of education: Not on file   Highest education level: Not on file  Occupational History   Not on file  Tobacco Use   Smoking status: Former    Packs/day: 0.50    Years: 5.00    Total pack years:  2.50    Types: Cigarettes   Smokeless tobacco: Never  Vaping Use   Vaping Use: Never used  Substance and Sexual Activity   Alcohol use: Not Currently    Comment: occasionally beer (holidays, special occasions)   Drug use: No   Sexual activity: Yes    Partners: Male  Other Topics Concern   Not on file  Social History Narrative   Not on file   Social Determinants of Health   Financial Resource Strain: Not on file  Food Insecurity: Not on file  Transportation Needs: Not on file  Physical Activity: Not on file  Stress: Not on file  Social Connections: Not on file  Intimate Partner Violence: Not on file   Family Status  Relation Name Status   Mother  Alive   Father  Alive   PGF  (Not Specified)   Family History  Problem Relation Age of Onset   Cancer Mother        thyroid   High blood pressure Mother    High Cholesterol Father    Diabetes Paternal Grandfather    Allergies  Allergen Reactions   Cephalexin Hives    Other Reaction: Other reaction  Nitrofurantoin Hives   Other Hives and Itching   Sulfa Antibiotics Hives   Topiramate Hives    Patient Care Team: Ronnell Freshwater, NP as PCP - General (Family Medicine) Lavera Guise, MD (Internal Medicine) Bary Castilla Forest Gleason, MD (General Surgery)   Medications: Outpatient Medications Prior to Visit  Medication Sig   clorazepate (TRANXENE) 7.5 MG tablet TAKE 1 TABLET BY MOUTH TWICE A DAY AS NEEDED FOR ANXIETY   ipratropium (ATROVENT) 0.06 % nasal spray SMARTSIG:2 Puff(s) Both Nares Every 8 Hours PRN   predniSONE (DELTASONE) 20 MG tablet Take 40 mg by mouth daily.   promethazine (PHENERGAN) 25 MG tablet Take 1 tablet (25 mg total) by mouth every 8 (eight) hours as needed for nausea or vomiting.   [DISCONTINUED] estrogens-methylTEST (ESTRATEST) 1.25-2.5 MG tablet Take 1 tablet by mouth daily.   [DISCONTINUED] gabapentin (NEURONTIN) 300 MG capsule Take 1 capsule (300 mg total) by mouth 3 (three) times daily.   No  facility-administered medications prior to visit.    Review of Systems  Constitutional:  Negative for activity change, appetite change, chills, fatigue and fever.  HENT:  Negative for congestion, postnasal drip, rhinorrhea, sinus pressure, sinus pain, sneezing and sore throat.   Eyes: Negative.   Respiratory:  Negative for cough, chest tightness, shortness of breath and wheezing.   Cardiovascular:  Negative for chest pain and palpitations.  Gastrointestinal:  Negative for abdominal pain, constipation, diarrhea, nausea and vomiting.  Endocrine: Negative for cold intolerance, heat intolerance, polydipsia and polyuria.  Genitourinary:  Negative for dyspareunia, dysuria, flank pain, frequency and urgency.  Musculoskeletal:  Negative for arthralgias, back pain and myalgias.  Skin:  Negative for rash.  Allergic/Immunologic: Negative for environmental allergies.  Neurological:  Negative for dizziness, weakness and headaches.  Hematological:  Negative for adenopathy.  Psychiatric/Behavioral:  The patient is nervous/anxious.        Objective     Today's Vitals   09/23/22 1030  BP: 116/73  Pulse: 83  SpO2: 99%  Weight: 143 lb (64.9 kg)  Height: _0  (1.651 m)   Body mass index is 23.8 kg/m.  BP Readings from Last 3 Encounters:  09/23/22 116/73  03/07/22 105/69  11/26/21 125/78    Wt Readings from Last 3 Encounters:  09/23/22 143 lb (64.9 kg)  03/07/22 147 lb (66.7 kg)  10/07/21 144 lb 4.8 oz (65.5 kg)     Physical Exam Vitals and nursing note reviewed.  Constitutional:      Appearance: Normal appearance. She is well-developed.  HENT:     Head: Normocephalic and atraumatic.     Right Ear: Tympanic membrane, ear canal and external ear normal.     Left Ear: Tympanic membrane, ear canal and external ear normal.     Nose: Nose normal.     Mouth/Throat:     Mouth: Mucous membranes are dry.     Pharynx: Oropharynx is clear.  Eyes:     Extraocular Movements: Extraocular  movements intact.     Conjunctiva/sclera: Conjunctivae normal.     Pupils: Pupils are equal, round, and reactive to light.  Cardiovascular:     Rate and Rhythm: Normal rate and regular rhythm.     Pulses: Normal pulses.     Heart sounds: Normal heart sounds.  Pulmonary:     Effort: Pulmonary effort is normal.     Breath sounds: Normal breath sounds.  Abdominal:     General: Bowel sounds are normal. There is no distension.     Palpations:  Abdomen is soft. There is no mass.     Tenderness: There is no abdominal tenderness. There is no right CVA tenderness, left CVA tenderness, guarding or rebound.     Hernia: No hernia is present.  Musculoskeletal:        General: Normal range of motion.     Cervical back: Normal range of motion and neck supple.  Lymphadenopathy:     Cervical: No cervical adenopathy.  Skin:    General: Skin is warm and dry.     Capillary Refill: Capillary refill takes less than 2 seconds.  Neurological:     General: No focal deficit present.     Mental Status: She is alert and oriented to person, place, and time.  Psychiatric:        Mood and Affect: Mood normal.        Behavior: Behavior normal.        Thought Content: Thought content normal.        Judgment: Judgment normal.     Last depression screening scores   Row Labels 09/23/2022   10:32 AM 03/07/2022    9:38 AM 05/07/2021   10:21 AM  PHQ 2/9 Scores   Section Header. No data exists in this row.     PHQ - 2 Score   0 0 0  PHQ- 9 Score   2 0 2   Last fall risk screening   Row Labels 05/07/2021   10:22 AM  Fall Risk    Section Header. No data exists in this row.   Falls in the past year?   0  Number falls in past yr:   0  Injury with Fall?   0  Follow up   Falls evaluation completed   Last Audit-C alcohol use screening   Row Labels 10/23/2020   10:50 AM  Alcohol Use Disorder Test (AUDIT)   Section Header. No data exists in this row.   1. How often do you have a drink containing alcohol?   1  2.  How many drinks containing alcohol do you have on a typical day when you are drinking?   0  3. How often do you have six or more drinks on one occasion?   0  AUDIT-C Score   1   A score of 3 or more in women, and 4 or more in men indicates increased risk for alcohol abuse, EXCEPT if all of the points are from question 1   Results for orders placed or performed in visit on 09/23/22  Urine Culture   Specimen: Urine   UR  Result Value Ref Range   Urine Culture, Routine Final report    Organism ID, Bacteria Lactobacillus species   CBC  Result Value Ref Range   WBC 10.5 3.4 - 10.8 x10E3/uL   RBC 4.73 3.77 - 5.28 x10E6/uL   Hemoglobin 14.7 11.1 - 15.9 g/dL   Hematocrit 44.3 34.0 - 46.6 %   MCV 94 79 - 97 fL   MCH 31.1 26.6 - 33.0 pg   MCHC 33.2 31.5 - 35.7 g/dL   RDW 12.9 11.7 - 15.4 %   Platelets 349 150 - 450 x10E3/uL  TSH + free T4  Result Value Ref Range   TSH 1.050 0.450 - 4.500 uIU/mL   Free T4 1.32 0.82 - 1.77 ng/dL  FSH/LH  Result Value Ref Range   LH 50.5 mIU/mL   FSH 80.0 mIU/mL  Estradiol  Result Value Ref Range   Estradiol 29.1  pg/mL  Testosterone  Result Value Ref Range   Testosterone 40 8 - 60 ng/dL  B12 and Folate Panel  Result Value Ref Range   Vitamin B-12 1,044 232 - 1,245 pg/mL   Folate 6.7 >3.0 ng/mL  Comp Met (CMET)  Result Value Ref Range   Glucose 81 70 - 99 mg/dL   BUN 11 6 - 20 mg/dL   Creatinine, Ser 0.99 0.57 - 1.00 mg/dL   eGFR 74 >59 mL/min/1.73   BUN/Creatinine Ratio 11 9 - 23   Sodium 139 134 - 144 mmol/L   Potassium 4.5 3.5 - 5.2 mmol/L   Chloride 102 96 - 106 mmol/L   CO2 25 20 - 29 mmol/L   Calcium 9.5 8.7 - 10.2 mg/dL   Total Protein 7.2 6.0 - 8.5 g/dL   Albumin 4.9 3.9 - 4.9 g/dL   Globulin, Total 2.3 1.5 - 4.5 g/dL   Albumin/Globulin Ratio 2.1 1.2 - 2.2   Bilirubin Total 0.9 0.0 - 1.2 mg/dL   Alkaline Phosphatase 87 44 - 121 IU/L   AST 10 0 - 40 IU/L   ALT 7 0 - 32 IU/L  Hemoglobin A1c  Result Value Ref Range   Hgb A1c MFr  Bld 5.7 (H) 4.8 - 5.6 %   Est. average glucose Bld gHb Est-mCnc 117 mg/dL  POCT URINALYSIS DIP (CLINITEK)  Result Value Ref Range   Color, UA yellow yellow   Clarity, UA clear clear   Glucose, UA negative negative mg/dL   Bilirubin, UA negative negative   Ketones, POC UA negative negative mg/dL   Spec Grav, UA 1.020 1.010 - 1.025   Blood, UA moderate (A) negative   pH, UA 7.0 5.0 - 8.0   POC PROTEIN,UA negative negative, trace   Urobilinogen, UA 0.2 0.2 or 1.0 E.U./dL   Nitrite, UA Negative Negative   Leukocytes, UA Negative Negative    Assessment & Plan    1. Encounter for general adult medical examination with abnormal findings Annual physical today   2. Symptomatic postsurgical menopause Check reproductive hormone levels. Adjust hormone treatment as indicated.  - FSH/LH - Estradiol - Testosterone  3. Urinary tract infection with hematuria, site unspecified Treat with cipro 500 mg twice daily for 7 days. Send urine for culture and sensitivity and adjust antibiotics as indicated.  - ciprofloxacin (CIPRO) 500 MG tablet; Take 1 tablet (500 mg total) by mouth 2 (two) times daily.  Dispense: 14 tablet; Refill: 0  4. Flank pain Treat for UTI with cipro 500 mg twice daily for 7 days. Send urine for culture and sensitivity and adjust antibiotics as indicated.  - POCT URINALYSIS DIP (CLINITEK) - Urine Culture; Future - Urine Culture - ciprofloxacin (CIPRO) 500 MG tablet; Take 1 tablet (500 mg total) by mouth 2 (two) times daily.  Dispense: 14 tablet; Refill: 0  5. Other fatigue Check cbc, thyroid panel, and anemia  panels for further evaluation. Treat deficiencies as indicated.  - CBC - TSH + free T4 - B12 and Folate Panel - Comp Met (CMET) - Hemoglobin A1c  6. Neuropathic pain May continue gabapentin 300 mg three times daily for pain  - gabapentin (NEURONTIN) 300 MG capsule; Take 1 capsule (300 mg total) by mouth 3 (three) times daily.  Dispense: 270 capsule; Refill:  1  7. Artificial menopause Continue Estratest as prescribed.  - estrogens-methylTEST (ESTRATEST) 1.25-2.5 MG tablet; Take 1 tablet by mouth daily.  Dispense: 90 tablet; Refill: 1  8. Family history of thyroid cancer Check  thyroid panel  - TSH + free T4  9. Healthcare maintenance Routine, fasting labs drawn during today's visit.  - CBC - Comp Met (CMET) - Hemoglobin A1c    Immunization History  Administered Date(s) Administered   Influenza-Unspecified 09/22/2019   Moderna Sars-Covid-2 Vaccination 06/30/2020, 07/28/2020    Health Maintenance  Topic Date Due   HIV Screening  Never done   DTaP/Tdap/Td (1 - Tdap) Never done   COVID-19 Vaccine (3 - Moderna risk series) 08/25/2020   INFLUENZA VACCINE  02/05/2023 (Originally 06/07/2022)   PAP SMEAR-Modifier  10/28/2023   HPV VACCINES  Aged Out   Hepatitis C Screening  Discontinued    Discussed health benefits of physical activity, and encouraged her to engage in regular exercise appropriate for her age and condition.  Problem List Items Addressed This Visit       Genitourinary   Urinary tract infection with hematuria   Relevant Medications   ciprofloxacin (CIPRO) 500 MG tablet     Other   Flank pain   Relevant Medications   ciprofloxacin (CIPRO) 500 MG tablet   Other Relevant Orders   POCT URINALYSIS DIP (CLINITEK) (Completed)   Urine Culture (Completed)   Artificial menopause   Relevant Medications   estrogens-methylTEST (ESTRATEST) 1.25-2.5 MG tablet   Encounter for general adult medical examination with abnormal findings - Primary   Neuropathic pain   Relevant Medications   gabapentin (NEURONTIN) 300 MG capsule   Family history of thyroid cancer   Relevant Orders   TSH + free T4 (Completed)   Other fatigue   Relevant Orders   CBC (Completed)   TSH + free T4 (Completed)   B12 and Folate Panel (Completed)   Comp Met (CMET) (Completed)   Hemoglobin A1c (Completed)   Symptomatic postsurgical menopause    Relevant Orders   FSH/LH (Completed)   Estradiol (Completed)   Testosterone (Completed)   Other Visit Diagnoses     Healthcare maintenance       Relevant Orders   CBC (Completed)   Comp Met (CMET) (Completed)   Hemoglobin A1c (Completed)        Return in about 6 months (around 03/24/2023) for mood, med refills.        Ronnell Freshwater, NP  [facility] [phone] (phone) [phone] (fax)  [place]

## 2022-09-24 LAB — COMPREHENSIVE METABOLIC PANEL
ALT: 7 IU/L (ref 0–32)
AST: 10 IU/L (ref 0–40)
Albumin/Globulin Ratio: 2.1 (ref 1.2–2.2)
Albumin: 4.9 g/dL (ref 3.9–4.9)
Alkaline Phosphatase: 87 IU/L (ref 44–121)
BUN/Creatinine Ratio: 11 (ref 9–23)
BUN: 11 mg/dL (ref 6–20)
Bilirubin Total: 0.9 mg/dL (ref 0.0–1.2)
CO2: 25 mmol/L (ref 20–29)
Calcium: 9.5 mg/dL (ref 8.7–10.2)
Chloride: 102 mmol/L (ref 96–106)
Creatinine, Ser: 0.99 mg/dL (ref 0.57–1.00)
Globulin, Total: 2.3 g/dL (ref 1.5–4.5)
Glucose: 81 mg/dL (ref 70–99)
Potassium: 4.5 mmol/L (ref 3.5–5.2)
Sodium: 139 mmol/L (ref 134–144)
Total Protein: 7.2 g/dL (ref 6.0–8.5)
eGFR: 74 mL/min/{1.73_m2} (ref >59)

## 2022-09-24 LAB — FSH/LH
FSH: 80 m[IU]/mL
LH: 50.5 m[IU]/mL

## 2022-09-24 LAB — TESTOSTERONE: Testosterone: 40 ng/dL (ref 8–60)

## 2022-09-24 LAB — B12 AND FOLATE PANEL
Folate: 6.7 ng/mL (ref >3.0)
Vitamin B-12: 1044 pg/mL (ref 232–1245)

## 2022-09-24 LAB — CBC
Hematocrit: 44.3 % (ref 34.0–46.6)
Hemoglobin: 14.7 g/dL (ref 11.1–15.9)
MCH: 31.1 pg (ref 26.6–33.0)
MCHC: 33.2 g/dL (ref 31.5–35.7)
MCV: 94 fL (ref 79–97)
Platelets: 349 10*3/uL (ref 150–450)
RBC: 4.73 x10E6/uL (ref 3.77–5.28)
RDW: 12.9 % (ref 11.7–15.4)
WBC: 10.5 10*3/uL (ref 3.4–10.8)

## 2022-09-24 LAB — HEMOGLOBIN A1C
Est. average glucose Bld gHb Est-mCnc: 117 mg/dL
Hgb A1c MFr Bld: 5.7 % — ABNORMAL HIGH (ref 4.8–5.6)

## 2022-09-24 LAB — ESTRADIOL: Estradiol: 29.1 pg/mL

## 2022-09-24 LAB — TSH+FREE T4
Free T4: 1.32 ng/dL (ref 0.82–1.77)
TSH: 1.05 u[IU]/mL (ref 0.450–4.500)

## 2022-09-27 LAB — URINE CULTURE

## 2022-10-08 DIAGNOSIS — R5383 Other fatigue: Secondary | ICD-10-CM | POA: Insufficient documentation

## 2022-10-08 DIAGNOSIS — E8941 Symptomatic postprocedural ovarian failure: Secondary | ICD-10-CM | POA: Insufficient documentation

## 2022-10-08 DIAGNOSIS — Z808 Family history of malignant neoplasm of other organs or systems: Secondary | ICD-10-CM | POA: Insufficient documentation

## 2022-10-23 ENCOUNTER — Encounter: Payer: Self-pay | Admitting: Nurse Practitioner

## 2022-10-24 ENCOUNTER — Other Ambulatory Visit: Payer: Self-pay | Admitting: Nurse Practitioner

## 2022-10-24 DIAGNOSIS — Z20828 Contact with and (suspected) exposure to other viral communicable diseases: Secondary | ICD-10-CM

## 2022-10-24 MED ORDER — OSELTAMIVIR PHOSPHATE 75 MG PO CAPS: 75.0000 mg | ORAL_CAPSULE | Freq: Two times a day (BID) | ORAL | 0 refills | Status: DC

## 2022-12-06 ENCOUNTER — Other Ambulatory Visit: Payer: Self-pay | Admitting: Nurse Practitioner

## 2022-12-06 DIAGNOSIS — N958 Other specified menopausal and perimenopausal disorders: Secondary | ICD-10-CM

## 2023-03-09 ENCOUNTER — Encounter: Payer: Self-pay | Admitting: Nurse Practitioner

## 2023-03-10 ENCOUNTER — Other Ambulatory Visit: Payer: Self-pay | Admitting: Nurse Practitioner

## 2023-03-10 DIAGNOSIS — N958 Other specified menopausal and perimenopausal disorders: Secondary | ICD-10-CM

## 2023-03-10 MED ORDER — EST ESTROGENS-METHYLTEST 1.25-2.5 MG PO TABS: 1.0000 | ORAL_TABLET | Freq: Every day | ORAL | 1 refills | Status: DC

## 2023-03-24 ENCOUNTER — Ambulatory Visit: Payer: 59 | Admitting: Family Medicine

## 2023-03-24 ENCOUNTER — Ambulatory Visit: Payer: 59 | Admitting: Nurse Practitioner

## 2023-03-24 ENCOUNTER — Encounter: Payer: Self-pay | Admitting: Family Medicine

## 2023-03-24 VITALS — BP 131/80 | HR 76 | Resp 18 | Ht 65.0 in | Wt 144.0 lb

## 2023-03-24 DIAGNOSIS — N029 Recurrent and persistent hematuria with unspecified morphologic changes: Secondary | ICD-10-CM

## 2023-03-24 DIAGNOSIS — Z1231 Encounter for screening mammogram for malignant neoplasm of breast: Secondary | ICD-10-CM

## 2023-03-24 DIAGNOSIS — N189 Chronic kidney disease, unspecified: Secondary | ICD-10-CM | POA: Insufficient documentation

## 2023-03-24 DIAGNOSIS — F411 Generalized anxiety disorder: Secondary | ICD-10-CM

## 2023-03-24 DIAGNOSIS — M792 Neuralgia and neuritis, unspecified: Secondary | ICD-10-CM

## 2023-03-24 DIAGNOSIS — E8941 Symptomatic postprocedural ovarian failure: Secondary | ICD-10-CM

## 2023-03-24 MED ORDER — GABAPENTIN 300 MG PO CAPS: 300.0000 mg | ORAL_CAPSULE | Freq: Three times a day (TID) | ORAL | 1 refills | Status: DC

## 2023-03-24 MED ORDER — CLORAZEPATE DIPOTASSIUM 7.5 MG PO TABS: ORAL_TABLET | ORAL | 3 refills | Status: DC

## 2023-03-24 MED ORDER — EST ESTROGENS-METHYLTEST 1.25-2.5 MG PO TABS: 1.0000 | ORAL_TABLET | Freq: Every day | ORAL | 1 refills | Status: DC

## 2023-03-24 NOTE — Assessment & Plan Note (Signed)
Controlled.  Continue gabapentin 300 mg 3 times daily.  Will continue to monitor.

## 2023-03-24 NOTE — Assessment & Plan Note (Signed)
GAD-7 score of 2, stable.  Continue Tranxene 7.5 mg up to twice daily as needed for breakthrough anxiety.  We discussed that if it ever becomes something that she needs every day, we will need to discuss a maintenance medication like an SSRI.  Patient verbalized understanding and is agreeable with this plan, though right now she feels that she is able to manage with only her as needed medication.

## 2023-03-24 NOTE — Patient Instructions (Signed)
It was nice to meet you today!  Your Pap smear that was done in 2021 also included a test for HPV, so you will not need another Pap smear until 2026.

## 2023-03-24 NOTE — Assessment & Plan Note (Addendum)
Followed by nephrology, they recently did lab work 02/28/2023 which was stable.  Continue follow-up with [facility] every 6 months.

## 2023-03-24 NOTE — Assessment & Plan Note (Signed)
Controlled.  Continue estrogen-methylitest 1.25-2.5 mg daily.  Will continue to monitor with office visit every 6 months.

## 2023-03-24 NOTE — Progress Notes (Signed)
Established Patient Office Visit  Subjective   Patient ID: Sara Peck, female    DOB: Jun 04, 1981  Age: 42 y.o. MRN: [id number]  Chief Complaint  Patient presents with   Anxiety   Depression    HPI Sara Peck is a 42 y.o. female presenting today for follow up of mood. Mood: Patient is here to follow up for anxiety, currently managing with Tranxene as needed, she only has to take it occasionally. Taking medication without side effects, reports excellent compliance with treatment. Denies mood changes or SI/HI. She feels mood is stable since last visit, though she has had increased stress as her dad was diagnosed with stage IV lung cancer recently.     03/24/2023    9:35 AM 09/23/2022   10:32 AM 03/07/2022    9:38 AM  Depression screen PHQ 2/9  Decreased Interest 0 0 0  Down, Depressed, Hopeless 0 0 0  PHQ - 2 Score 0 0 0  Altered sleeping 0 0 0  Tired, decreased energy 0 1 0  Change in appetite 0 0 0  Feeling bad or failure about yourself  0 0 0  Trouble concentrating 0 1 0  Moving slowly or fidgety/restless 0 0 0  Suicidal thoughts 0 0 0  PHQ-9 Score 0 2 0  Difficult doing work/chores Not difficult at all         03/24/2023    9:35 AM 09/23/2022   10:32 AM 03/07/2022    9:38 AM 05/07/2021   10:22 AM  GAD 7 : Generalized Anxiety Score  Nervous, Anxious, on Edge 1 1 0 0  Control/stop worrying 0 0 0 0  Worry too much - different things 0 0 0 0  Trouble relaxing 1 0 0 0  Restless 0 0 0 0  Easily annoyed or irritable 0 0 0 0  Afraid - awful might happen 0 0 0 0  Total GAD 7 Score 2 1 0 0  Anxiety Difficulty Not difficult at all      ROS Negative unless otherwise noted in HPI   Objective:     BP 131/80 (BP Location: Left Arm, Patient Position: Sitting, Cuff Size: Normal)   Pulse 76   Resp 18   Ht 5\' 5"  (1.651 m)   Wt 144 lb (65.3 kg)   SpO2 99%   BMI 23.96 kg/m   Physical Exam Constitutional:      General: She is not in acute distress.    Appearance:  Normal appearance.  HENT:     Head: Normocephalic and atraumatic.  Cardiovascular:     Rate and Rhythm: Regular rhythm.     Pulses: Normal pulses.     Heart sounds: No murmur heard.    No friction rub. No gallop.  Pulmonary:     Effort: Pulmonary effort is normal. No respiratory distress.     Breath sounds: No wheezing, rhonchi or rales.  Musculoskeletal:     Cervical back: Normal range of motion.  Skin:    General: Skin is warm and dry.  Neurological:     General: No focal deficit present.     Mental Status: She is alert and oriented to person, place, and time. Mental status is at baseline.  Psychiatric:        Mood and Affect: Mood normal.        Thought Content: Thought content normal.        Judgment: Judgment normal.      Assessment & Plan:  GAD (generalized anxiety disorder) Assessment & Plan: GAD-7 score of 2, stable.  Continue Tranxene 7.5 mg up to twice daily as needed for breakthrough anxiety.  We discussed that if it ever becomes something that she needs every day, we will need to discuss a maintenance medication like an SSRI.  Patient verbalized understanding and is agreeable with this plan, though right now she feels that she is able to manage with only her as needed medication.  Orders: -     Clorazepate Dipotassium; TAKE 1 TABLET BY MOUTH TWICE A DAY AS NEEDED FOR ANXIETY  Dispense: 60 tablet; Refill: 3  Neuropathic pain Assessment & Plan: Controlled.  Continue gabapentin 300 mg 3 times daily.  Will continue to monitor.  Orders: -     Gabapentin; Take 1 capsule (300 mg total) by mouth 3 (three) times daily.  Dispense: 270 capsule; Refill: 1  Symptomatic postsurgical menopause Assessment & Plan: Controlled.  Continue estrogen-methylitest 1.25-2.5 mg daily.  Will continue to monitor with office visit every 6 months.  Orders: -     Est Estrogens-Methyltest; Take 1 tablet by mouth daily.  Dispense: 90 tablet; Refill: 1  Thin basement membrane  nephropathy Assessment & Plan: Followed by nephrology, they recently did lab work 02/28/2023 which was stable.  Continue follow-up with [facility] every 6 months.   Screening mammogram for breast cancer -     Digital Screening Mammogram, Left and Right; Future    Return in about 6 months (around 09/24/2023) for annual physical, fasting blood work 1 week before.    Melida Quitter, PA

## 2023-03-27 ENCOUNTER — Encounter: Payer: Self-pay | Admitting: Family Medicine

## 2023-03-27 DIAGNOSIS — Z1231 Encounter for screening mammogram for malignant neoplasm of breast: Secondary | ICD-10-CM

## 2023-04-04 NOTE — Addendum Note (Signed)
Addended by: Tonny Bollman on: 04/04/2023 09:30 AM   Modules accepted: Orders

## 2023-04-05 ENCOUNTER — Other Ambulatory Visit: Payer: Self-pay | Admitting: Family Medicine

## 2023-04-05 DIAGNOSIS — Z1231 Encounter for screening mammogram for malignant neoplasm of breast: Secondary | ICD-10-CM

## 2023-04-14 ENCOUNTER — Ambulatory Visit
Admission: RE | Admit: 2023-04-14 | Discharge: 2023-04-14 | Disposition: A | Discharge status: Home / Self Care | Payer: 59 | Source: Ambulatory Visit | Attending: Family Medicine | Admitting: Family Medicine

## 2023-04-14 DIAGNOSIS — Z1231 Encounter for screening mammogram for malignant neoplasm of breast: Secondary | ICD-10-CM | POA: Diagnosis present

## 2023-09-08 ENCOUNTER — Ambulatory Visit: Payer: 59

## 2023-09-11 ENCOUNTER — Other Ambulatory Visit: Payer: Self-pay

## 2023-09-11 DIAGNOSIS — Z0000 Encounter for general adult medical examination without abnormal findings: Secondary | ICD-10-CM

## 2023-09-11 DIAGNOSIS — R5383 Other fatigue: Secondary | ICD-10-CM

## 2023-09-11 NOTE — Progress Notes (Signed)
lip

## 2023-09-19 ENCOUNTER — Other Ambulatory Visit: Payer: 59

## 2023-09-19 DIAGNOSIS — R5383 Other fatigue: Secondary | ICD-10-CM

## 2023-09-19 DIAGNOSIS — Z0000 Encounter for general adult medical examination without abnormal findings: Secondary | ICD-10-CM

## 2023-09-20 LAB — CBC WITH DIFFERENTIAL/PLATELET
Basophils Absolute: 0.1 10*3/uL (ref 0.0–0.2)
Basos: 1 %
EOS (ABSOLUTE): 0.2 10*3/uL (ref 0.0–0.4)
Eos: 2 %
Hematocrit: 44.3 % (ref 34.0–46.6)
Hemoglobin: 14.8 g/dL (ref 11.1–15.9)
Immature Grans (Abs): 0 10*3/uL (ref 0.0–0.1)
Immature Granulocytes: 0 %
Lymphocytes Absolute: 2.4 10*3/uL (ref 0.7–3.1)
Lymphs: 24 %
MCH: 31.4 pg (ref 26.6–33.0)
MCHC: 33.4 g/dL (ref 31.5–35.7)
MCV: 94 fL (ref 79–97)
Monocytes Absolute: 0.7 10*3/uL (ref 0.1–0.9)
Monocytes: 7 %
Neutrophils Absolute: 6.6 10*3/uL (ref 1.4–7.0)
Neutrophils: 66 %
Platelets: 493 10*3/uL — ABNORMAL HIGH (ref 150–450)
RBC: 4.72 x10E6/uL (ref 3.77–5.28)
RDW: 12.3 % (ref 11.7–15.4)
WBC: 9.9 10*3/uL (ref 3.4–10.8)

## 2023-09-20 LAB — LIPID PANEL
Chol/HDL Ratio: 3.4 ratio (ref 0.0–4.4)
Cholesterol, Total: 145 mg/dL (ref 100–199)
HDL: 43 mg/dL (ref >39)
LDL Chol Calc (NIH): 83 mg/dL (ref 0–99)
Triglycerides: 102 mg/dL (ref 0–149)
VLDL Cholesterol Cal: 19 mg/dL (ref 5–40)

## 2023-09-20 LAB — COMPREHENSIVE METABOLIC PANEL
ALT: 13 [IU]/L (ref 0–32)
AST: 15 [IU]/L (ref 0–40)
Albumin: 4.7 g/dL (ref 3.9–4.9)
Alkaline Phosphatase: 105 [IU]/L (ref 44–121)
BUN/Creatinine Ratio: 15 (ref 9–23)
BUN: 16 mg/dL (ref 6–24)
Bilirubin Total: 0.5 mg/dL (ref 0.0–1.2)
CO2: 25 mmol/L (ref 20–29)
Calcium: 9.4 mg/dL (ref 8.7–10.2)
Chloride: 100 mmol/L (ref 96–106)
Creatinine, Ser: 1.05 mg/dL — ABNORMAL HIGH (ref 0.57–1.00)
Globulin, Total: 2.4 g/dL (ref 1.5–4.5)
Glucose: 86 mg/dL (ref 70–99)
Potassium: 4.7 mmol/L (ref 3.5–5.2)
Sodium: 138 mmol/L (ref 134–144)
Total Protein: 7.1 g/dL (ref 6.0–8.5)
eGFR: 69 mL/min/{1.73_m2} (ref >59)

## 2023-09-20 LAB — TSH: TSH: 0.917 u[IU]/mL (ref 0.450–4.500)

## 2023-09-20 LAB — HEMOGLOBIN A1C
Est. average glucose Bld gHb Est-mCnc: 117 mg/dL
Hgb A1c MFr Bld: 5.7 % — ABNORMAL HIGH (ref 4.8–5.6)

## 2023-09-26 ENCOUNTER — Ambulatory Visit (INDEPENDENT_AMBULATORY_CARE_PROVIDER_SITE_OTHER): Payer: 59 | Admitting: Family Medicine

## 2023-09-26 ENCOUNTER — Encounter: Payer: Self-pay | Admitting: Family Medicine

## 2023-09-26 VITALS — BP 111/71 | HR 71 | Resp 18 | Ht 65.0 in | Wt 142.0 lb

## 2023-09-26 DIAGNOSIS — F411 Generalized anxiety disorder: Secondary | ICD-10-CM | POA: Diagnosis not present

## 2023-09-26 DIAGNOSIS — M792 Neuralgia and neuritis, unspecified: Secondary | ICD-10-CM

## 2023-09-26 DIAGNOSIS — Z1159 Encounter for screening for other viral diseases: Secondary | ICD-10-CM

## 2023-09-26 DIAGNOSIS — N029 Recurrent and persistent hematuria with unspecified morphologic changes: Secondary | ICD-10-CM

## 2023-09-26 DIAGNOSIS — E8941 Symptomatic postprocedural ovarian failure: Secondary | ICD-10-CM | POA: Diagnosis not present

## 2023-09-26 DIAGNOSIS — Z0000 Encounter for general adult medical examination without abnormal findings: Secondary | ICD-10-CM | POA: Diagnosis not present

## 2023-09-26 MED ORDER — CLORAZEPATE DIPOTASSIUM 7.5 MG PO TABS: 3.7500 mg | ORAL_TABLET | ORAL | 0 refills | Status: DC | PRN

## 2023-09-26 MED ORDER — GABAPENTIN 300 MG PO CAPS: 300.0000 mg | ORAL_CAPSULE | Freq: Three times a day (TID) | ORAL | 1 refills | Status: DC

## 2023-09-26 MED ORDER — EST ESTROGENS-METHYLTEST 1.25-2.5 MG PO TABS: 1.0000 | ORAL_TABLET | Freq: Every day | ORAL | 1 refills | Status: DC

## 2023-09-26 NOTE — Assessment & Plan Note (Signed)
Controlled.  Continue estrogen-methylitest 1.25-2.5 mg daily.  Will continue to monitor with office visit every 6 months.

## 2023-09-26 NOTE — Progress Notes (Signed)
Complete physical exam  Patient: Sara Peck   DOB: 09/09/1981   42 y.o. Female  MRN: [id number]  Subjective:    Chief Complaint  Patient presents with   Annual Exam    Sara Peck is a 42 y.o. female who presents today for a complete physical exam. She reports consuming a general diet.  She would like to exercise more than she currently does but is doing her best to stay active.  She generally feels fairly well. She does not have additional problems to discuss today.  She continues to see nephrology on a regular basis due to loin pain hematuria syndrome.   Most recent fall risk assessment:    03/24/2023    9:35 AM  Fall Risk   Falls in the past year? 0  Number falls in past yr: 0  Injury with Fall? 0  Risk for fall due to : No Fall Risks  Follow up Falls evaluation completed     Most recent depression and anxiety screenings:    09/26/2023    1:10 PM 03/24/2023    9:35 AM  PHQ 2/9 Scores  PHQ - 2 Score 1 0  PHQ- 9 Score 4 0      09/26/2023    1:11 PM 03/24/2023    9:35 AM 09/23/2022   10:32 AM 03/07/2022    9:38 AM  GAD 7 : Generalized Anxiety Score  Nervous, Anxious, on Edge 0 1 1 0  Control/stop worrying 0 0 0 0  Worry too much - different things 0 0 0 0  Trouble relaxing 0 1 0 0  Restless 0 0 0 0  Easily annoyed or irritable 0 0 0 0  Afraid - awful might happen 0 0 0 0  Total GAD 7 Score 0 2 1 0  Anxiety Difficulty Not difficult at all Not difficult at all      Patient Active Problem List   Diagnosis Date Noted   Chronic kidney disease 03/24/2023   Family history of thyroid cancer 10/08/2022   Other fatigue 10/08/2022   Acquired hypothyroidism 03/07/2022   Generalized edema 05/10/2021   Neuropathic pain 04/12/2020   Personal history of other malignant neoplasm of skin 10/23/2018   Pain syndrome, chronic 02/16/2018   GAD (generalized anxiety disorder) 12/15/2017   Thin basement membrane nephropathy 04/12/2017   Migraine without aura and without  status migrainosus, not intractable 09/15/2016   Endometriosis 01/19/2015   Adult attention deficit disorder 12/24/2014   Symptomatic postsurgical menopause 01/08/2014    Past Surgical History:  Procedure Laterality Date   ABDOMINAL HYSTERECTOMY  12-27-13   APPENDECTOMY  12-27-13   COLONOSCOPY  2014   [facility]   LAPAROSCOPY     multiple   MOLE REMOVAL     MYRINGOTOMY WITH TUBE PLACEMENT Bilateral 03/18/2016   Procedure: MYRINGOTOMY WITH TUBE PLACEMENT;  Surgeon: Linus Salmons, MD;  Location: [facility];  Service: ENT;  Laterality: Bilateral;  BUTTERFLY TUBES   NASAL ENDOSCOPY  2014   [facility]   PARTIAL HYSTERECTOMY  Jan 2012   with left ovary/tube   renal autotransplantion     SKIN LESION EXCISION  2015   back   TYMPANOSTOMY TUBE PLACEMENT  Jan 2012   Social History   Tobacco Use   Smoking status: Former    Current packs/day: 0.50    Average packs/day: 0.5 packs/day for 5.0 years (2.5 ttl pk-yrs)    Types: Cigarettes    Passive exposure: Never  Smokeless tobacco: Never  Vaping Use   Vaping status: Never Used  Substance Use Topics   Alcohol use: Not Currently    Comment: occasionally beer (holidays, special occasions)   Drug use: No   Family History  Problem Relation Age of Onset   Cancer Mother        thyroid   High blood pressure Mother    High Cholesterol Father    Diabetes Paternal Grandfather    Allergies  Allergen Reactions   Cephalexin Hives    Other Reaction: Other reaction    Nitrofurantoin Hives   Other Hives and Itching   Sulfa Antibiotics Hives   Topiramate Hives     Patient Care Team: Melida Quitter, PA as PCP - General (Family Medicine) Lyndon Code, MD (Internal Medicine) Lemar Livings Merrily Pew, MD (General Surgery)   Outpatient Medications Prior to Visit  Medication Sig   ipratropium (ATROVENT) 0.06 % nasal spray SMARTSIG:2 Puff(s) Both Nares Every 8 Hours PRN   [DISCONTINUED] ciprofloxacin (CIPRO) 500 MG tablet Take 1 tablet  (500 mg total) by mouth 2 (two) times daily.   [DISCONTINUED] clorazepate (TRANXENE) 7.5 MG tablet TAKE 1 TABLET BY MOUTH TWICE A DAY AS NEEDED FOR ANXIETY   [DISCONTINUED] estrogens-methylTEST (ESTRATEST) 1.25-2.5 MG tablet Take 1 tablet by mouth daily.   [DISCONTINUED] gabapentin (NEURONTIN) 300 MG capsule Take 1 capsule (300 mg total) by mouth 3 (three) times daily.   [DISCONTINUED] predniSONE (DELTASONE) 20 MG tablet Take 40 mg by mouth daily.   [DISCONTINUED] promethazine (PHENERGAN) 25 MG tablet Take 1 tablet (25 mg total) by mouth every 8 (eight) hours as needed for nausea or vomiting.   No facility-administered medications prior to visit.    Review of Systems  Constitutional:  Negative for chills, fever and malaise/fatigue.  HENT:  Negative for congestion and hearing loss.   Eyes:  Negative for blurred vision and double vision.  Respiratory:  Negative for cough and shortness of breath.   Cardiovascular:  Negative for chest pain, palpitations and leg swelling.  Gastrointestinal:  Positive for abdominal pain (Followed by nephrology). Negative for constipation, diarrhea and heartburn.  Genitourinary:  Negative for frequency and urgency.  Musculoskeletal:  Negative for myalgias and neck pain.  Neurological:  Negative for headaches.  Endo/Heme/Allergies:  Negative for polydipsia.  Psychiatric/Behavioral:  Negative for depression. The patient is not nervous/anxious.       Objective:    BP 111/71 (BP Location: Left Arm, Patient Position: Sitting, Cuff Size: Normal)   Pulse 71   Resp 18   Ht 5\' 5"  (1.651 m)   Wt 142 lb (64.4 kg)   SpO2 100%   BMI 23.63 kg/m    Physical Exam Constitutional:      General: She is not in acute distress.    Appearance: Normal appearance.  HENT:     Head: Normocephalic and atraumatic.     Right Ear: Tympanic membrane, ear canal and external ear normal.     Left Ear: Tympanic membrane, ear canal and external ear normal.     Ears:     Comments:  Bilateral tympanic membrane tubes in place    Nose: Nose normal.     Mouth/Throat:     Mouth: Mucous membranes are moist.     Pharynx: No oropharyngeal exudate or posterior oropharyngeal erythema.  Eyes:     Extraocular Movements: Extraocular movements intact.     Conjunctiva/sclera: Conjunctivae normal.     Pupils: Pupils are equal, round, and reactive to light.  Comments: Does not wear glasses or contacts  Neck:     Thyroid: No thyroid mass, thyromegaly or thyroid tenderness.  Cardiovascular:     Rate and Rhythm: Normal rate and regular rhythm.     Heart sounds: Normal heart sounds. No murmur heard.    No friction rub. No gallop.  Pulmonary:     Effort: Pulmonary effort is normal. No respiratory distress.     Breath sounds: Normal breath sounds. No wheezing, rhonchi or rales.  Abdominal:     General: Abdomen is flat. Bowel sounds are normal. There is no distension.     Palpations: There is no mass.     Tenderness: There is no abdominal tenderness. There is no guarding.  Musculoskeletal:        General: Normal range of motion.     Cervical back: Normal range of motion and neck supple.  Lymphadenopathy:     Cervical: No cervical adenopathy.  Skin:    General: Skin is warm and dry.  Neurological:     Mental Status: She is alert and oriented to person, place, and time.     Cranial Nerves: No cranial nerve deficit.     Motor: No weakness.     Deep Tendon Reflexes: Reflexes normal.  Psychiatric:        Mood and Affect: Mood normal.       Assessment & Plan:    Routine Health Maintenance and Physical Exam  Immunization History  Administered Date(s) Administered   Influenza-Unspecified 09/22/2019   Moderna Sars-Covid-2 Vaccination 06/30/2020, 07/28/2020    Health Maintenance  Topic Date Due   HIV Screening  Never done   Hepatitis C Screening  Never done   DTaP/Tdap/Td (1 - Tdap) Never done   COVID-19 Vaccine (3 - Moderna risk series) 10/12/2023 (Originally  08/25/2020)   INFLUENZA VACCINE  02/05/2024 (Originally 06/08/2023)   Cervical Cancer Screening (HPV/Pap Cotest)  10/26/2025   HPV VACCINES  Aged Out    Reviewed most recent labs including CBC, CMP, lipid panel, A1C, TSH, and vitamin D. All within normal limits/stable from last check other than A1c stable at 5.7, slight increase in creatinine to 1.05, platelets mildly elevated 493. Agreeable to updating tetanus booster at next appointment.  Discussed health benefits of physical activity, and encouraged her to engage in regular exercise appropriate for her age and condition.  Wellness examination  GAD (generalized anxiety disorder) Assessment & Plan: GAD-7 score 0, stable.  Continue to use clorazepate 1/2-1 whole 7.5 mg tablet as needed only for breakthrough severe anxiety.  PDMP reviewed, no aberrancies.  Overdose risk score 320.  Orders: -     Clorazepate Dipotassium; Take 0.5-1 tablets (3.75-7.5 mg total) by mouth as needed for anxiety (for BREAKTHROUGH SEVERE ANXIETY).  Dispense: 30 tablet; Refill: 0  Thin basement membrane nephropathy Assessment & Plan: Followed by nephrology.slight increase in creatinine to 1.050.  Continue follow-up with [facility] every 6 months.   Symptomatic postsurgical menopause Assessment & Plan: Controlled.  Continue estrogen-methylitest 1.25-2.5 mg daily.  Will continue to monitor with office visit every 6 months.  Orders: -     Est Estrogens-Methyltest; Take 1 tablet by mouth daily.  Dispense: 90 tablet; Refill: 1 -     CBC with Differential/Platelet; Future  Neuropathic pain Assessment & Plan: Controlled.  Continue gabapentin 300 mg 3 times daily.  Will continue to monitor.  Orders: -     Gabapentin; Take 1 capsule (300 mg total) by mouth 3 (three) times daily.  Dispense:  270 capsule; Refill: 1    Return in about 6 months (around 03/25/2024) for follow-up for HRT, mood, nonfasting blood work 1 week before.     Melida Quitter, PA

## 2023-09-26 NOTE — Assessment & Plan Note (Signed)
Controlled.  Continue gabapentin 300 mg 3 times daily.  Will continue to monitor.

## 2023-09-26 NOTE — Assessment & Plan Note (Addendum)
GAD-7 score 0, stable.  Continue to use clorazepate 1/2-1 whole 7.5 mg tablet as needed only for breakthrough severe anxiety.  PDMP reviewed, no aberrancies.  Overdose risk score 320.

## 2023-09-26 NOTE — Assessment & Plan Note (Signed)
Followed by nephrology.slight increase in creatinine to 1.050.  Continue follow-up with [facility] every 6 months.

## 2023-09-26 NOTE — Patient Instructions (Signed)
Sara Peck could not find your tetanus booster, so we will update that at your next appointment!

## 2023-12-14 ENCOUNTER — Encounter: Payer: Self-pay | Admitting: Family Medicine

## 2024-03-12 ENCOUNTER — Encounter: Payer: Self-pay | Admitting: Family Medicine

## 2024-03-20 ENCOUNTER — Other Ambulatory Visit: Payer: 59

## 2024-03-26 ENCOUNTER — Ambulatory Visit: Payer: 59 | Admitting: Family Medicine

## 2024-03-28 ENCOUNTER — Telehealth: Payer: Self-pay

## 2024-03-28 ENCOUNTER — Other Ambulatory Visit: Payer: Self-pay | Admitting: Family Medicine

## 2024-03-28 DIAGNOSIS — E8941 Symptomatic postprocedural ovarian failure: Secondary | ICD-10-CM

## 2024-03-28 NOTE — Telephone Encounter (Signed)
 Copied from CRM [id number]. Topic: Clinical - Prescription Issue >> Mar 28, 2024  9:16 AM Lizabeth Riggs wrote: Reason for CRM:  Mahlia wants to know if estrogens -methylTEST (ESTRATEST) 1.25-2.5 MG tablet can be refilled before her appointment in June. She is out of medication. Chart shows the refill is in process but has not been sent to [facility] [place]. Please send Kambry a message through MyChart if this can be refilled or if it cannot be refilled. Thanks

## 2024-04-19 ENCOUNTER — Other Ambulatory Visit: Payer: Self-pay

## 2024-04-19 DIAGNOSIS — Z13220 Encounter for screening for lipoid disorders: Secondary | ICD-10-CM

## 2024-04-19 DIAGNOSIS — D696 Thrombocytopenia, unspecified: Secondary | ICD-10-CM

## 2024-04-19 DIAGNOSIS — R7303 Prediabetes: Secondary | ICD-10-CM

## 2024-04-19 DIAGNOSIS — Z130 Encounter for screening for diseases of the blood and blood-forming organs and certain disorders involving the immune mechanism: Secondary | ICD-10-CM

## 2024-04-19 DIAGNOSIS — R5383 Other fatigue: Secondary | ICD-10-CM

## 2024-04-19 DIAGNOSIS — Z1159 Encounter for screening for other viral diseases: Secondary | ICD-10-CM

## 2024-04-19 DIAGNOSIS — E039 Hypothyroidism, unspecified: Secondary | ICD-10-CM

## 2024-04-19 DIAGNOSIS — Z1231 Encounter for screening mammogram for malignant neoplasm of breast: Secondary | ICD-10-CM

## 2024-04-19 DIAGNOSIS — N183 Chronic kidney disease, stage 3 unspecified: Secondary | ICD-10-CM

## 2024-04-23 ENCOUNTER — Other Ambulatory Visit

## 2024-04-23 DIAGNOSIS — D696 Thrombocytopenia, unspecified: Secondary | ICD-10-CM

## 2024-04-23 DIAGNOSIS — E039 Hypothyroidism, unspecified: Secondary | ICD-10-CM

## 2024-04-23 DIAGNOSIS — R7303 Prediabetes: Secondary | ICD-10-CM

## 2024-04-23 DIAGNOSIS — Z13220 Encounter for screening for lipoid disorders: Secondary | ICD-10-CM

## 2024-04-23 DIAGNOSIS — Z1159 Encounter for screening for other viral diseases: Secondary | ICD-10-CM

## 2024-04-23 DIAGNOSIS — R5383 Other fatigue: Secondary | ICD-10-CM

## 2024-04-23 DIAGNOSIS — Z130 Encounter for screening for diseases of the blood and blood-forming organs and certain disorders involving the immune mechanism: Secondary | ICD-10-CM

## 2024-04-23 DIAGNOSIS — N183 Chronic kidney disease, stage 3 unspecified: Secondary | ICD-10-CM

## 2024-04-24 ENCOUNTER — Ambulatory Visit: Payer: Self-pay

## 2024-04-24 LAB — LIPID PANEL
Chol/HDL Ratio: 3.7 ratio (ref 0.0–4.4)
Cholesterol, Total: 168 mg/dL (ref 100–199)
HDL: 46 mg/dL (ref >39)
LDL Chol Calc (NIH): 107 mg/dL — ABNORMAL HIGH (ref 0–99)
Triglycerides: 78 mg/dL (ref 0–149)
VLDL Cholesterol Cal: 15 mg/dL (ref 5–40)

## 2024-04-24 LAB — CBC WITH DIFFERENTIAL/PLATELET
Basophils Absolute: 0.1 10*3/uL (ref 0.0–0.2)
Basos: 1 %
EOS (ABSOLUTE): 0.2 10*3/uL (ref 0.0–0.4)
Eos: 3 %
Hematocrit: 45.5 % (ref 34.0–46.6)
Hemoglobin: 14.7 g/dL (ref 11.1–15.9)
Immature Grans (Abs): 0 10*3/uL (ref 0.0–0.1)
Immature Granulocytes: 0 %
Lymphocytes Absolute: 2.2 10*3/uL (ref 0.7–3.1)
Lymphs: 31 %
MCH: 31.7 pg (ref 26.6–33.0)
MCHC: 32.3 g/dL (ref 31.5–35.7)
MCV: 98 fL — ABNORMAL HIGH (ref 79–97)
Monocytes Absolute: 0.5 10*3/uL (ref 0.1–0.9)
Monocytes: 7 %
Neutrophils Absolute: 4.3 10*3/uL (ref 1.4–7.0)
Neutrophils: 58 %
Platelets: 379 10*3/uL (ref 150–450)
RBC: 4.64 x10E6/uL (ref 3.77–5.28)
RDW: 13 % (ref 11.7–15.4)
WBC: 7.3 10*3/uL (ref 3.4–10.8)

## 2024-04-24 LAB — COMPREHENSIVE METABOLIC PANEL WITH GFR
ALT: 13 IU/L (ref 0–32)
AST: 17 IU/L (ref 0–40)
Albumin: 4.8 g/dL (ref 3.9–4.9)
Alkaline Phosphatase: 98 IU/L (ref 44–121)
BUN/Creatinine Ratio: 14 (ref 9–23)
BUN: 13 mg/dL (ref 6–24)
Bilirubin Total: 0.6 mg/dL (ref 0.0–1.2)
CO2: 21 mmol/L (ref 20–29)
Calcium: 9.6 mg/dL (ref 8.7–10.2)
Chloride: 103 mmol/L (ref 96–106)
Creatinine, Ser: 0.92 mg/dL (ref 0.57–1.00)
Globulin, Total: 2.3 g/dL (ref 1.5–4.5)
Glucose: 84 mg/dL (ref 70–99)
Potassium: 4.5 mmol/L (ref 3.5–5.2)
Sodium: 141 mmol/L (ref 134–144)
Total Protein: 7.1 g/dL (ref 6.0–8.5)
eGFR: 80 mL/min/{1.73_m2} (ref >59)

## 2024-04-24 LAB — VITAMIN D 25 HYDROXY (VIT D DEFICIENCY, FRACTURES): Vit D, 25-Hydroxy: 22.7 ng/mL — ABNORMAL LOW (ref 30.0–100.0)

## 2024-04-24 LAB — TSH: TSH: 1.72 u[IU]/mL (ref 0.450–4.500)

## 2024-04-24 LAB — HIV ANTIBODY (ROUTINE TESTING W REFLEX): HIV Screen 4th Generation wRfx: NONREACTIVE

## 2024-04-24 LAB — HEPATITIS C ANTIBODY: Hep C Virus Ab: NONREACTIVE

## 2024-04-24 LAB — HEMOGLOBIN A1C
Est. average glucose Bld gHb Est-mCnc: 111 mg/dL
Hgb A1c MFr Bld: 5.5 % (ref 4.8–5.6)

## 2024-04-25 ENCOUNTER — Ambulatory Visit
Admission: RE | Admit: 2024-04-25 | Discharge: 2024-04-25 | Disposition: A | Discharge status: Home / Self Care | Source: Ambulatory Visit

## 2024-04-25 DIAGNOSIS — Z1231 Encounter for screening mammogram for malignant neoplasm of breast: Secondary | ICD-10-CM | POA: Diagnosis present

## 2024-04-29 ENCOUNTER — Ambulatory Visit: Payer: Self-pay

## 2024-04-30 ENCOUNTER — Ambulatory Visit

## 2024-04-30 VITALS — BP 122/80 | HR 84 | Ht 65.0 in | Wt 141.0 lb

## 2024-04-30 DIAGNOSIS — E039 Hypothyroidism, unspecified: Secondary | ICD-10-CM

## 2024-04-30 DIAGNOSIS — F411 Generalized anxiety disorder: Secondary | ICD-10-CM | POA: Diagnosis not present

## 2024-04-30 DIAGNOSIS — E559 Vitamin D deficiency, unspecified: Secondary | ICD-10-CM | POA: Insufficient documentation

## 2024-04-30 DIAGNOSIS — E8941 Symptomatic postprocedural ovarian failure: Secondary | ICD-10-CM | POA: Diagnosis not present

## 2024-04-30 DIAGNOSIS — E785 Hyperlipidemia, unspecified: Secondary | ICD-10-CM | POA: Insufficient documentation

## 2024-04-30 DIAGNOSIS — M792 Neuralgia and neuritis, unspecified: Secondary | ICD-10-CM | POA: Diagnosis not present

## 2024-04-30 MED ORDER — GABAPENTIN 300 MG PO CAPS: 300.0000 mg | ORAL_CAPSULE | Freq: Three times a day (TID) | ORAL | 0 refills | Status: DC

## 2024-04-30 MED ORDER — EST ESTROGENS-METHYLTEST DS 1.25-2.5 MG PO TABS: 1.0000 | ORAL_TABLET | Freq: Every day | ORAL | 1 refills | Status: DC

## 2024-04-30 NOTE — Assessment & Plan Note (Signed)
Controlled.  Continue estrogen-methylitest 1.25-2.5 mg daily.  Will continue to monitor with office visit every 6 months.

## 2024-04-30 NOTE — Assessment & Plan Note (Signed)
Controlled.  Continue gabapentin 300 mg 3 times daily.  Will continue to monitor.

## 2024-04-30 NOTE — Progress Notes (Signed)
 Established Patient Office Visit  Subjective   Patient ID: Sara Peck, female    DOB: 12/29/1980  Age: 43 y.o. MRN: [id number]  Chief Complaint  Patient presents with   Medical Management of Chronic Issues    HPI  Sara Peck is a 43 y.o. y/o female who presents to the clinic today for follow up on HRT, mood.  HRT: Currently on estrogen-methylitest 1.25-2.5 mg daily. Reports these are controlling her vasomotor symptoms well. Denies HA, leg pain, or vaginal bleeding.   Mood: Reports her anxiety has been worse recently due to recent health issues within her family. Currently using the tranxene  as needed. Reports she does not currently need a refill. Not interested in starting SSRI for daily maintenance.     ROS Per HPI.    Objective:     BP 122/80   Pulse 84   Ht 5' 5 (1.651 m)   Wt 141 lb (64 kg)   SpO2 98%   BMI 23.46 kg/m    Physical Exam Constitutional:      General: She is not in acute distress.    Appearance: Normal appearance.   Cardiovascular:     Rate and Rhythm: Normal rate and regular rhythm.     Heart sounds: Normal heart sounds. No murmur heard.    No friction rub. No gallop.  Pulmonary:     Effort: Pulmonary effort is normal. No respiratory distress.     Breath sounds: Normal breath sounds.   Musculoskeletal:        General: No swelling.   Skin:    General: Skin is warm and dry.   Neurological:     General: No focal deficit present.     Mental Status: She is alert.   Psychiatric:        Mood and Affect: Mood normal.        Behavior: Behavior normal.        Thought Content: Thought content normal.    No results found for any visits on 04/30/24.  Last CBC Lab Results  Component Value Date   WBC 7.3 04/23/2024   HGB 14.7 04/23/2024   HCT 45.5 04/23/2024   MCV 98 (H) 04/23/2024   MCH 31.7 04/23/2024   RDW 13.0 04/23/2024   PLT 379 04/23/2024   Last metabolic panel Lab Results  Component Value Date   GLUCOSE 84  04/23/2024   NA 141 04/23/2024   K 4.5 04/23/2024   CL 103 04/23/2024   CO2 21 04/23/2024   BUN 13 04/23/2024   CREATININE 0.92 04/23/2024   EGFR 80 04/23/2024   CALCIUM 9.6 04/23/2024   PROT 7.1 04/23/2024   ALBUMIN 4.8 04/23/2024   LABGLOB 2.3 04/23/2024   AGRATIO 2.1 09/23/2022   BILITOT 0.6 04/23/2024   ALKPHOS 98 04/23/2024   AST 17 04/23/2024   ALT 13 04/23/2024   ANIONGAP 8 08/14/2015   Last lipids Lab Results  Component Value Date   CHOL 168 04/23/2024   HDL 46 04/23/2024   LDLCALC 107 (H) 04/23/2024   TRIG 78 04/23/2024   CHOLHDL 3.7 04/23/2024   Last hemoglobin A1c Lab Results  Component Value Date   HGBA1C 5.5 04/23/2024   Last thyroid  functions Lab Results  Component Value Date   TSH 1.720 04/23/2024   T3TOTAL 148 08/14/2015   Last vitamin D  Lab Results  Component Value Date   VD25OH 22.7 (L) 04/23/2024      The 10-year ASCVD risk score (Arnett DK, et al.,  2019) is: 0.6%    Assessment & Plan:   GAD (generalized anxiety disorder) Assessment & Plan: Stable. Continue to use clorazepate  1/2-1 whole 7.5 mg tablet as needed only for breakthrough severe anxiety.    Symptomatic postsurgical menopause Assessment & Plan: Controlled. Continue estrogen-methylitest 1.25-2.5 mg daily. Will continue to monitor with office visit every 6 months.   Orders: -     Est Estrogens -Methyltest DS; Take 1 tablet by mouth daily.  Dispense: 90 tablet; Refill: 1  Neuropathic pain Assessment & Plan: Controlled.  Continue gabapentin  300 mg 3 times daily.  Will continue to monitor.  Orders: -     Gabapentin ; Take 1 capsule (300 mg total) by mouth 3 (three) times daily.  Dispense: 90 capsule; Refill: 0  Acquired hypothyroidism  Vitamin D  insufficiency Assessment & Plan: Most recent Vit D 22.7. Recommend starting an OTC Vit D3 supplement of 2000 I.U daily. Will recheck in 6 months.   Hyperlipidemia, unspecified hyperlipidemia type Assessment & Plan: Last lipid  panel: LDL 107, HDL 46, Trig 78. Not currently on any lipid lowering medications. Advised patient to limit intake of saturated/trans fats. Will control with diet for now. Recheck in 6 months.    Return in about 6 months (around 10/30/2024) for HRT, mood.    Sara JULIANNA Sacks, PA-C

## 2024-04-30 NOTE — Assessment & Plan Note (Signed)
 Most recent Vit D 22.7. Recommend starting an OTC Vit D3 supplement of 2000 I.U daily. Will recheck in 6 months.

## 2024-04-30 NOTE — Patient Instructions (Addendum)
 It was nice to see you today!  As we discussed in clinic:  -I have sent refills of your gabapentin  and estrogen to your preferred pharmacy!  -All of your lab work was either normal or stable from the last time we checked it, which is good news!  -Over the next 6 months, try to monitor your intake of saturated/trans fat to keep your cholesterol down.  -I also recommend an over-the-counter vitamin D  supplement (Vitamin D3 2000 I.U.) daily to replenish your vitamin D  levels. We can recheck this at next visit.   -I will plan to see you back in 6 months for follow up, or sooner if you need us !  It was nice to meet you!!   If you have any problems before your next visit feel free to message me via MyChart (minor issues or questions) or call the office, otherwise you may reach out to schedule an office visit.  Thank you! Saddie Sacks, PA-C

## 2024-04-30 NOTE — Assessment & Plan Note (Signed)
 Stable. Continue to use clorazepate  1/2-1 whole 7.5 mg tablet as needed only for breakthrough severe anxiety.

## 2024-04-30 NOTE — Assessment & Plan Note (Signed)
 Last lipid panel: LDL 107, HDL 46, Trig 78. Not currently on any lipid lowering medications. Advised patient to limit intake of saturated/trans fats. Will control with diet for now. Recheck in 6 months.

## 2024-06-09 DIAGNOSIS — F17200 Nicotine dependence, unspecified, uncomplicated: Secondary | ICD-10-CM

## 2024-06-10 ENCOUNTER — Other Ambulatory Visit: Payer: Self-pay

## 2024-06-10 DIAGNOSIS — F17200 Nicotine dependence, unspecified, uncomplicated: Secondary | ICD-10-CM

## 2024-06-10 MED ORDER — VARENICLINE TARTRATE 0.5 MG PO TABS: ORAL_TABLET | ORAL | 0 refills | Status: AC

## 2024-06-12 MED ORDER — BUPROPION HCL ER (SR) 150 MG PO TB12
ORAL_TABLET | ORAL | 0 refills | Status: DC
Start: 2024-06-12 — End: 2024-07-05

## 2024-06-12 NOTE — Addendum Note (Signed)
 Addended byBETHA GAYLE NUMBERS on: 06/12/2024 08:05 AM   Modules accepted: Orders

## 2024-07-05 DIAGNOSIS — F17200 Nicotine dependence, unspecified, uncomplicated: Secondary | ICD-10-CM

## 2024-07-05 MED ORDER — BUPROPION HCL ER (SR) 150 MG PO TB12: 150.0000 mg | ORAL_TABLET | Freq: Two times a day (BID) | ORAL | 1 refills | Status: DC

## 2024-08-04 ENCOUNTER — Other Ambulatory Visit: Payer: Self-pay

## 2024-08-04 DIAGNOSIS — F17200 Nicotine dependence, unspecified, uncomplicated: Secondary | ICD-10-CM

## 2024-09-25 ENCOUNTER — Other Ambulatory Visit: Payer: Self-pay

## 2024-09-25 DIAGNOSIS — M792 Neuralgia and neuritis, unspecified: Secondary | ICD-10-CM

## 2024-10-27 ENCOUNTER — Other Ambulatory Visit: Payer: Self-pay

## 2024-10-27 DIAGNOSIS — E039 Hypothyroidism, unspecified: Secondary | ICD-10-CM

## 2024-10-27 DIAGNOSIS — N183 Chronic kidney disease, stage 3 unspecified: Secondary | ICD-10-CM

## 2024-10-27 DIAGNOSIS — R7303 Prediabetes: Secondary | ICD-10-CM

## 2024-10-27 DIAGNOSIS — E785 Hyperlipidemia, unspecified: Secondary | ICD-10-CM

## 2024-10-27 DIAGNOSIS — E559 Vitamin D deficiency, unspecified: Secondary | ICD-10-CM

## 2024-10-28 ENCOUNTER — Other Ambulatory Visit

## 2024-10-28 DIAGNOSIS — E039 Hypothyroidism, unspecified: Secondary | ICD-10-CM

## 2024-10-28 DIAGNOSIS — R7303 Prediabetes: Secondary | ICD-10-CM

## 2024-10-28 DIAGNOSIS — N183 Chronic kidney disease, stage 3 unspecified: Secondary | ICD-10-CM

## 2024-10-28 DIAGNOSIS — E559 Vitamin D deficiency, unspecified: Secondary | ICD-10-CM

## 2024-10-28 DIAGNOSIS — E785 Hyperlipidemia, unspecified: Secondary | ICD-10-CM

## 2024-10-29 ENCOUNTER — Ambulatory Visit: Payer: Self-pay

## 2024-10-29 LAB — COMPREHENSIVE METABOLIC PANEL WITH GFR
ALT: 18 IU/L (ref 0–32)
AST: 20 IU/L (ref 0–40)
Albumin: 4.6 g/dL (ref 3.9–4.9)
Alkaline Phosphatase: 86 IU/L (ref 41–116)
BUN/Creatinine Ratio: 15 (ref 9–23)
BUN: 15 mg/dL (ref 6–24)
Bilirubin Total: 0.6 mg/dL (ref 0.0–1.2)
CO2: 24 mmol/L (ref 20–29)
Calcium: 9.3 mg/dL (ref 8.7–10.2)
Chloride: 103 mmol/L (ref 96–106)
Creatinine, Ser: 1.01 mg/dL — ABNORMAL HIGH (ref 0.57–1.00)
Globulin, Total: 2.3 g/dL (ref 1.5–4.5)
Glucose: 81 mg/dL (ref 70–99)
Potassium: 4.3 mmol/L (ref 3.5–5.2)
Sodium: 140 mmol/L (ref 134–144)
Total Protein: 6.9 g/dL (ref 6.0–8.5)
eGFR: 72 mL/min/1.73

## 2024-10-29 LAB — LIPID PANEL
Chol/HDL Ratio: 2.5 ratio (ref 0.0–4.4)
Cholesterol, Total: 136 mg/dL (ref 100–199)
HDL: 55 mg/dL
LDL Chol Calc (NIH): 71 mg/dL (ref 0–99)
Triglycerides: 45 mg/dL (ref 0–149)
VLDL Cholesterol Cal: 10 mg/dL (ref 5–40)

## 2024-10-29 LAB — CBC WITH DIFFERENTIAL/PLATELET
Basophils Absolute: 0.1 x10E3/uL (ref 0.0–0.2)
Basos: 2 %
EOS (ABSOLUTE): 0.1 x10E3/uL (ref 0.0–0.4)
Eos: 3 %
Hematocrit: 39.6 % (ref 34.0–46.6)
Hemoglobin: 13.2 g/dL (ref 11.1–15.9)
Immature Grans (Abs): 0 x10E3/uL (ref 0.0–0.1)
Immature Granulocytes: 0 %
Lymphocytes Absolute: 1.6 x10E3/uL (ref 0.7–3.1)
Lymphs: 40 %
MCH: 31.7 pg (ref 26.6–33.0)
MCHC: 33.3 g/dL (ref 31.5–35.7)
MCV: 95 fL (ref 79–97)
Monocytes Absolute: 0.4 x10E3/uL (ref 0.1–0.9)
Monocytes: 9 %
Neutrophils Absolute: 1.9 x10E3/uL (ref 1.4–7.0)
Neutrophils: 46 %
Platelets: 335 x10E3/uL (ref 150–450)
RBC: 4.17 x10E6/uL (ref 3.77–5.28)
RDW: 12.2 % (ref 11.7–15.4)
WBC: 4.1 x10E3/uL (ref 3.4–10.8)

## 2024-10-29 LAB — HEMOGLOBIN A1C
Est. average glucose Bld gHb Est-mCnc: 105 mg/dL
Hgb A1c MFr Bld: 5.3 % (ref 4.8–5.6)

## 2024-10-29 LAB — VITAMIN D 25 HYDROXY (VIT D DEFICIENCY, FRACTURES): Vit D, 25-Hydroxy: 19 ng/mL — ABNORMAL LOW (ref 30.0–100.0)

## 2024-10-29 LAB — TSH: TSH: 1.94 u[IU]/mL (ref 0.450–4.500)

## 2024-10-31 ENCOUNTER — Other Ambulatory Visit: Payer: Self-pay

## 2024-10-31 DIAGNOSIS — E8941 Symptomatic postprocedural ovarian failure: Secondary | ICD-10-CM

## 2024-11-05 ENCOUNTER — Other Ambulatory Visit: Payer: Self-pay

## 2024-11-05 ENCOUNTER — Ambulatory Visit

## 2024-11-05 VITALS — BP 118/76 | HR 78 | Temp 98.5°F | Ht 65.0 in | Wt 145.0 lb

## 2024-11-05 DIAGNOSIS — M792 Neuralgia and neuritis, unspecified: Secondary | ICD-10-CM | POA: Diagnosis not present

## 2024-11-05 DIAGNOSIS — E8941 Symptomatic postprocedural ovarian failure: Secondary | ICD-10-CM

## 2024-11-05 DIAGNOSIS — E559 Vitamin D deficiency, unspecified: Secondary | ICD-10-CM

## 2024-11-05 DIAGNOSIS — Z716 Tobacco abuse counseling: Secondary | ICD-10-CM | POA: Insufficient documentation

## 2024-11-05 DIAGNOSIS — F411 Generalized anxiety disorder: Secondary | ICD-10-CM | POA: Diagnosis not present

## 2024-11-05 DIAGNOSIS — F988 Other specified behavioral and emotional disorders with onset usually occurring in childhood and adolescence: Secondary | ICD-10-CM | POA: Diagnosis not present

## 2024-11-05 MED ORDER — GABAPENTIN 300 MG PO CAPS: 300.0000 mg | ORAL_CAPSULE | Freq: Two times a day (BID) | ORAL | 2 refills | Status: AC

## 2024-11-05 MED ORDER — CHOLECALCIFEROL 1.25 MG (50000 UT) PO CAPS: 50000.0000 [IU] | ORAL_CAPSULE | Freq: Every day | ORAL | 0 refills | Status: DC

## 2024-11-05 MED ORDER — CHOLECALCIFEROL 1.25 MG (50000 UT) PO TABS: 50000.0000 [IU] | ORAL_TABLET | ORAL | 0 refills | Status: DC

## 2024-11-05 MED ORDER — CLORAZEPATE DIPOTASSIUM 7.5 MG PO TABS: 3.7500 mg | ORAL_TABLET | ORAL | 0 refills | Status: AC | PRN

## 2024-11-05 MED ORDER — EST ESTROGENS-METHYLTEST DS 1.25-2.5 MG PO TABS: 1.0000 | ORAL_TABLET | Freq: Every day | ORAL | 2 refills | Status: DC

## 2024-11-05 NOTE — Assessment & Plan Note (Signed)
Controlled.  Continue estrogen-methylitest 1.25-2.5 mg daily.  Will continue to monitor with office visit every 6 months.

## 2024-11-05 NOTE — Assessment & Plan Note (Signed)
 Complete cessation achieved. Managed with Wellbutrin  150 mg in the morning. Afternoon cravings noted. Discussed potential tapering of Wellbutrin  if symptoms improve.

## 2024-11-05 NOTE — Assessment & Plan Note (Signed)
 Increased recently due to recent death of her father. Continue to use clorazepate  1/2-1 whole 7.5 mg tablet as needed for breakthrough severe anxiety.

## 2024-11-05 NOTE — Assessment & Plan Note (Signed)
 Will treat with Rx strength supplement 50000 units weekly x 12 weeks. . Recommend starting an OTC Vit D3 supplement of 2000 I.U daily thereafter.

## 2024-11-05 NOTE — Assessment & Plan Note (Signed)
 Controlled. Continue gabapentin  300 mg 2 times daily. Will continue to monitor.

## 2024-11-05 NOTE — Patient Instructions (Signed)
 VISIT SUMMARY: Today, you came in for a routine checkup and medication refills. We discussed your nicotine dependence, medication management, neurocognitive symptoms, fatigue, and recent genetic testing results. We also reviewed your recent lab results and updated your prescriptions.  YOUR PLAN: SYMPTOMATIC POSTSURGICAL MENOPAUSE: You are continuing on estrogen therapy to manage your symptoms. -Your estrogen prescription has been refilled for the next three months.  NEUROPATHIC PAIN: You are managing your neuropathic pain with gabapentin . -Your gabapentin  prescription has been refilled.  GENERALIZED ANXIETY DISORDER: You are currently taking Wellbutrin  150 mg in the morning to manage your anxiety. You have afternoon cravings for nicotine and sometimes forget to take your afternoon dose. -Continue taking Wellbutrin  150 mg in the morning. -Try to remember to take your afternoon dose of Wellbutrin  regularly. -We will consider tapering Wellbutrin  in 3-4 months if your symptoms improve. -We discussed the potential initiation of Adderall for your ADHD symptoms if Wellbutrin  is tapered.  VITAMIN D  DEFICIENCY: Your recent lab results show a vitamin D  deficiency with a level of 19. -Take vitamin D  50,000 units once weekly for 12 weeks. -Take vitamin D  with your largest meal for better absorption.  GENERAL HEALTH MAINTENANCE: You are up to date on your Pap smear. You declined tetanus, flu, and pneumonia vaccines. Your lab results show normal blood counts, kidney and liver function, and improved cholesterol and A1c levels. Your vitamin D  level is low. -Your blood pressure was rechecked before you left. -[facility].  ACNE: I recommended getting a benzoyl peroxide 5-10% wash or gel to apply in the mornings followed by spot treatment with retinol in the evenings. If the acne does not improve in 6-8 weeks, let me know and we can try something different.   If you have  any problems before your next visit feel free to message me via MyChart (minor issues or questions) or call the office, otherwise you may reach out to schedule an office visit.  Thank you! Saddie Sacks, PA-C

## 2024-11-05 NOTE — Assessment & Plan Note (Signed)
 Pt previously treated with Adderall several years ago. Currently considering going back on medication. We discussed treatment options including stimulant vs non stimulant. Patient would like to continue considering and will notify if this is something she wants to restart.

## 2024-11-05 NOTE — Progress Notes (Signed)
 "  Established Patient Office Visit  Subjective   Patient ID: Sara Peck, female    DOB: [birth year]  Age: 43 y.o. MRN: [id number]  Chief Complaint  Patient presents with   Medication Management    HPI  Discussed the use of AI scribe software for clinical note transcription with the patient, who gave verbal consent to proceed.  History of Present Illness   Sara Peck is a 43 year old female who presents for a routine checkup and medication refills.  Nicotine dependence and smoking cessation - Began smoking at age 55 - Currently smoke-free with the aid of Wellbutrin  150 mg BID - Afternoon cravings for nicotine persist but remain controlled - Occasionally forgets to take afternoon dose of Wellbutrin   Medication management - Currently taking estrogen, gabapentin  300 mg twice daily, and Tranxene  - Patient's father recently passed away 2/2 lung cancer which has been hard for her especially with the holidays   Neurocognitive symptoms - History of adult ADHD, previously treated with Adderall with good effect - Experiencing 'brain fog' and difficulty with focus - Considering restarting ADHD medication - Would like to think about this before making decision to restart medication  Fatigue and low energy - Recent laboratory evaluation revealed vitamin D  deficiency  - Experiencing exhaustion and low energy, possibly related to vitamin D  deficiency  Genetic testing - 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    Objective:     BP 118/76   Pulse 78   Temp 98.5 F (36.9 C) (Oral)   Ht 5' 5 (1.651 m)   Wt 145 lb 0.6 oz (65.8 kg)   SpO2 100%   BMI 24.14 kg/m    Physical Exam Constitutional:      General: She is not in  acute distress.    Appearance: Normal appearance.  Eyes:     Pupils: Pupils are equal, round, and reactive to light.  Cardiovascular:     Rate and Rhythm: Normal rate and regular rhythm.     Heart sounds: Normal heart sounds. No murmur heard.    No friction rub. No gallop.  Pulmonary:     Effort: Pulmonary effort is normal. No respiratory distress.     Breath sounds: Normal breath sounds.  Abdominal:     General: Bowel sounds are normal.  Musculoskeletal:        General: No swelling.     Cervical back: Neck supple.  Lymphadenopathy:     Cervical: No cervical adenopathy.  Skin:    General: Skin is warm and dry.  Neurological:     General: No focal deficit present.     Mental Status: She is alert.  Psychiatric:        Mood and Affect: Mood normal.        Behavior: Behavior normal.        Thought Content: Thought content normal.      No results found for any visits on 11/05/24.  Last CBC Lab Results  Component Value Date   WBC 4.1 10/28/2024   HGB 13.2 10/28/2024   HCT 39.6 10/28/2024   MCV 95 10/28/2024   MCH 31.7 10/28/2024   RDW 12.2 10/28/2024   PLT 335 10/28/2024  Last metabolic panel Lab Results  Component Value Date   GLUCOSE 81 10/28/2024   NA 140 10/28/2024   K 4.3 10/28/2024   CL 103 10/28/2024   CO2 24 10/28/2024   BUN 15 10/28/2024   CREATININE 1.01 (H) 10/28/2024   EGFR 72 10/28/2024   CALCIUM 9.3 10/28/2024   PROT 6.9 10/28/2024   ALBUMIN 4.6 10/28/2024   LABGLOB 2.3 10/28/2024   AGRATIO 2.1 09/23/2022   BILITOT 0.6 10/28/2024   ALKPHOS 86 10/28/2024   AST 20 10/28/2024   ALT 18 10/28/2024   ANIONGAP 8 08/14/2015   Last lipids Lab Results  Component Value Date   CHOL 136 10/28/2024   HDL 55 10/28/2024   LDLCALC 71 10/28/2024   TRIG 45 10/28/2024   CHOLHDL 2.5 10/28/2024   Last hemoglobin A1c Lab Results  Component Value Date   HGBA1C 5.3 10/28/2024   Last thyroid  functions Lab Results  Component Value Date   TSH 1.940  10/28/2024   T3TOTAL 148 08/14/2015   FREET4 1.32 09/23/2022   Last vitamin D  Lab Results  Component Value Date   VD25OH 19.0 (L) 10/28/2024      The 10-year ASCVD risk score (Arnett DK, et al., 2019) is: 0.2%    Assessment & Plan:   Vitamin D  deficiency -     Cholecalciferol; Take 50,000 Units by mouth once a week.  Dispense: 12 tablet; Refill: 0  Symptomatic postsurgical menopause Assessment & Plan: Controlled. Continue estrogen-methylitest 1.25-2.5 mg daily. Will continue to monitor with office visit every 6 months.   Orders: -     Est Estrogens -Methyltest DS; Take 1 tablet by mouth daily.  Dispense: 90 tablet; Refill: 2  Neuropathic pain Assessment & Plan: Controlled. Continue gabapentin  300 mg 2 times daily. Will continue to monitor.   Orders: -     Gabapentin ; Take 1 capsule (300 mg total) by mouth 2 (two) times daily.  Dispense: 60 capsule; Refill: 2  GAD (generalized anxiety disorder) Assessment & Plan: Increased recently due to recent death of her father. Continue to use clorazepate  1/2-1 whole 7.5 mg tablet as needed for breakthrough severe anxiety.   Orders: -     Clorazepate  Dipotassium; Take 0.5-1 tablets (3.75-7.5 mg total) by mouth as needed for anxiety (for BREAKTHROUGH SEVERE ANXIETY).  Dispense: 30 tablet; Refill: 0  Vitamin D  insufficiency Assessment & Plan: Will treat with Rx strength supplement 50000 units weekly x 12 weeks. . Recommend starting an OTC Vit D3 supplement of 2000 I.U daily thereafter.    Encounter for smoking cessation counseling Assessment & Plan: Complete cessation achieved. Managed with Wellbutrin  150 mg in the morning. Afternoon cravings noted. Discussed potential tapering of Wellbutrin  if symptoms improve.    Adult attention deficit disorder Assessment & Plan: Pt previously treated with Adderall several years ago. Currently considering going back on medication. We discussed treatment options including stimulant vs non  stimulant. Patient would like to continue considering and will notify if this is something she wants to restart.      Return in about 6 months (around 05/06/2025) for Physical.    Saddie JULIANNA Sacks, PA-C "

## 2024-11-08 DIAGNOSIS — E8941 Symptomatic postprocedural ovarian failure: Secondary | ICD-10-CM

## 2024-11-10 MED ORDER — EST ESTROGENS-METHYLTEST DS 1.25-2.5 MG PO TABS: 1.0000 | ORAL_TABLET | Freq: Every day | ORAL | 5 refills | Status: AC

## 2024-11-10 MED ORDER — EST ESTROGENS-METHYLTEST DS 1.25-2.5 MG PO TABS: 1.0000 | ORAL_TABLET | Freq: Every day | ORAL | 5 refills | Status: DC

## 2025-05-08 ENCOUNTER — Other Ambulatory Visit

## 2025-05-15 ENCOUNTER — Encounter
